# Patient Record
Sex: Female | Born: 1990
Health system: Southern US, Community
[De-identification: ages and names within clinical notes are randomized; demographics above are authoritative.]

## PROBLEM LIST (undated history)

## (undated) DIAGNOSIS — F32A Depression, unspecified: Secondary | ICD-10-CM

## (undated) DIAGNOSIS — A599 Trichomoniasis, unspecified: Secondary | ICD-10-CM

## (undated) DIAGNOSIS — N939 Abnormal uterine and vaginal bleeding, unspecified: Secondary | ICD-10-CM

## (undated) DIAGNOSIS — N898 Other specified noninflammatory disorders of vagina: Secondary | ICD-10-CM

## (undated) DIAGNOSIS — N920 Excessive and frequent menstruation with regular cycle: Secondary | ICD-10-CM

## (undated) DIAGNOSIS — E119 Type 2 diabetes mellitus without complications: Secondary | ICD-10-CM

## (undated) DIAGNOSIS — Z309 Encounter for contraceptive management, unspecified: Secondary | ICD-10-CM

## (undated) DIAGNOSIS — E669 Obesity, unspecified: Secondary | ICD-10-CM

## (undated) DIAGNOSIS — Z8619 Personal history of other infectious and parasitic diseases: Secondary | ICD-10-CM

## (undated) HISTORY — DX: Excessive and frequent menstruation with regular cycle: N92.0

## (undated) HISTORY — DX: Other specified noninflammatory disorders of vagina: N89.8

## (undated) HISTORY — DX: Personal history of other infectious and parasitic diseases: Z86.19

## (undated) HISTORY — DX: Encounter for contraceptive management, unspecified: Z30.9

## (undated) HISTORY — DX: Abnormal uterine and vaginal bleeding, unspecified: N93.9

## (undated) HISTORY — DX: Obesity, unspecified: E66.9

## (undated) HISTORY — DX: Trichomoniasis, unspecified: A59.9

---
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
## 2011-02-27 ENCOUNTER — Emergency Department (HOSPITAL_COMMUNITY)
Admission: EM | Admit: 2011-02-27 | Discharge: 2011-02-27 | Disposition: A | Discharge status: Home / Self Care | Payer: Medicaid Other | Attending: Emergency Medicine | Admitting: Emergency Medicine

## 2011-02-27 DIAGNOSIS — R21 Rash and other nonspecific skin eruption: Secondary | ICD-10-CM | POA: Insufficient documentation

## 2011-12-29 ENCOUNTER — Encounter (HOSPITAL_COMMUNITY): Payer: Self-pay | Admitting: *Deleted

## 2011-12-29 ENCOUNTER — Emergency Department (HOSPITAL_COMMUNITY)
Admission: EM | Admit: 2011-12-29 | Discharge: 2011-12-29 | Disposition: A | Discharge status: Home / Self Care | Payer: Medicaid Other | Attending: Emergency Medicine | Admitting: Emergency Medicine

## 2011-12-29 DIAGNOSIS — J069 Acute upper respiratory infection, unspecified: Secondary | ICD-10-CM

## 2011-12-29 MED ORDER — PREDNISONE 10 MG PO TABS: ORAL_TABLET | ORAL | Status: DC

## 2011-12-29 MED ORDER — GUAIFENESIN-CODEINE 100-10 MG/5ML PO SYRP: 10.0000 mL | ORAL_SOLUTION | Freq: Three times a day (TID) | ORAL | Status: AC | PRN

## 2011-12-29 NOTE — Discharge Instructions (Signed)
Cool Mist Vaporizers Vaporizers may help relieve the symptoms of a cough and cold. By adding water to the air, mucus may become thinner and less sticky. This makes it easier to breathe and cough up secretions. Vaporizers have not been proven to show they help with colds. You should not use a vaporizer if you are allergic to mold. Cool mist vaporizers do not cause serious burns like hot mist vaporizers ("steamers"). HOME CARE INSTRUCTIONS  Follow the package instructions for your vaporizer.   Use a vaporizer that holds a large volume of water (1 to 2 gallons [5.7 to 7.5 liters]).   Do not use anything other than distilled water in the vaporizer.   Do not run the vaporizer all of the time. This can cause mold or bacteria to grow in the vaporizer.   Clean the vaporizer after each time you use it.   Clean and dry the vaporizer well before you store it.   Stop using a vaporizer if you develop worsening respiratory symptoms.  Document Released: 05/13/2004 Document Revised: 08/05/2011 Document Reviewed: 04/10/2009 [facility] Patient Information 2012 [place], [place].Upper Respiratory Infection, Adult An upper respiratory infection (URI) is also known as the common cold. It is often caused by a type of germ (virus). Colds are easily spread (contagious). You can pass it to others by kissing, coughing, sneezing, or drinking out of the same glass. Usually, you get better in 1 or 2 weeks.  HOME CARE   Only take medicine as told by your doctor.   Use a warm mist humidifier or breathe in steam from a hot shower.   Drink enough water and fluids to keep your pee (urine) clear or pale yellow.   Get plenty of rest.   Return to work when your temperature is back to normal or as told by your doctor. You may use a face mask and wash your hands to stop your cold from spreading.  GET HELP RIGHT AWAY IF:   After the first few days, you feel you are getting worse.   You have questions about your medicine.     You have chills, shortness of breath, or brown or red spit (mucus).   You have yellow or brown snot (nasal discharge) or pain in the face, especially when you bend forward.   You have a fever, puffy (swollen) neck, pain when you swallow, or white spots in the back of your throat.   You have a bad headache, ear pain, sinus pain, or chest pain.   You have a high-pitched whistling sound when you breathe in and out (wheezing).   You have a lasting cough or cough up blood.   You have sore muscles or a stiff neck.  MAKE SURE YOU:   Understand these instructions.   Will watch your condition.   Will get help right away if you are not doing well or get worse.  Document Released: 02/02/2008 Document Revised: 08/05/2011 Document Reviewed: 12/21/2010 [facility] Patient Information 2012 [place], [place].

## 2011-12-29 NOTE — ED Notes (Signed)
Sore throat, ear pain, sinus congestion.headache.

## 2011-12-29 NOTE — ED Notes (Signed)
Sneezing, nasal congestion, scratchy throat and HA since yesterday, denies fever or N/V

## 2012-01-02 NOTE — ED Provider Notes (Signed)
History     CSN: [id number]  Arrival date & time 12/29/11  2032   First MD Initiated Contact with Patient 12/29/11 2138      Chief Complaint  Patient presents with  . Sinusitis    (Consider location/radiation/quality/duration/timing/severity/associated sxs/prior treatment) Patient is a 21 y.o. female presenting with sinusitis. The history is provided by the patient.  Sinusitis  This is a new problem. The current episode started more than 2 days ago. The problem has not changed since onset.There has been no fever. The pain is mild. Associated symptoms include congestion, sinus pressure, sore throat and cough. Pertinent negatives include no chills, no ear pain, no hoarse voice, no swollen glands and no shortness of breath. She has tried nothing for the symptoms. The treatment provided no relief.    History reviewed. No pertinent past medical history.  History reviewed. No pertinent past surgical history.  History reviewed. No pertinent family history.  History  Substance Use Topics  . Smoking status: Never Smoker   . Smokeless tobacco: Not on file  . Alcohol Use: No    OB History    Grav Para Term Preterm Abortions TAB SAB Ect Mult Living                  Review of Systems  Constitutional: Negative for chills.  HENT: Positive for congestion, sore throat, rhinorrhea and sinus pressure. Negative for ear pain, hoarse voice and trouble swallowing.   Respiratory: Positive for cough. Negative for shortness of breath, wheezing and stridor.   Gastrointestinal: Negative for nausea and vomiting.  Musculoskeletal: Negative for arthralgias.  Skin: Negative for rash.  Neurological: Positive for headaches. Negative for dizziness, facial asymmetry, weakness and numbness.  All other systems reviewed and are negative.    Allergies  Review of patient's allergies indicates no known allergies.  Home Medications   Current Outpatient Rx  Name Route Sig Dispense Refill  .  GUAIFENESIN-CODEINE 100-10 MG/5ML PO SYRP Oral Take 10 mLs by mouth 3 (three) times daily as needed for cough. 100 mL 0  . PREDNISONE 10 MG PO TABS  Take 6 tablets day one, 5 tablets day two, 4 tablets day three, 3 tablets day four, 2 tablets day five, then 1 tablet day six 21 tablet 0    BP 136/72  Pulse 93  Temp(Src) 97.9 F (36.6 C) (Oral)  Resp 20  Ht 5\' 4"  (1.626 m)  Wt 260 lb (117.935 kg)  BMI 44.63 kg/m2  SpO2 100%  LMP 11/30/2011  Physical Exam  Nursing note and vitals reviewed. Constitutional: She is oriented to person, place, and time. She appears well-developed and well-nourished. No distress.  HENT:  Head: Normocephalic and atraumatic.  Right Ear: Tympanic membrane and ear canal normal.  Left Ear: Tympanic membrane and ear canal normal.  Nose: Mucosal edema and rhinorrhea present. Right sinus exhibits maxillary sinus tenderness and frontal sinus tenderness. Left sinus exhibits maxillary sinus tenderness and frontal sinus tenderness.  Mouth/Throat: Uvula is midline, oropharynx is clear and moist and mucous membranes are normal.  Neck: Normal range of motion. Neck supple.  Cardiovascular: Normal rate, regular rhythm, normal heart sounds and intact distal pulses.   No murmur heard. Pulmonary/Chest: Effort normal and breath sounds normal. No respiratory distress.  Musculoskeletal: Normal range of motion.  Lymphadenopathy:    She has no cervical adenopathy.  Neurological: She is alert and oriented to person, place, and time. She exhibits normal muscle tone. Coordination normal.  Skin: Skin is warm and dry.  ED Course  Procedures (including critical care time)    1. Acute URI       MDM     Patient is alert, NOn-toxic appearing.  Vitals stable.  Sx's likely viral.    Patient / Family / Caregiver understand and agree with initial ED impression and plan with expectations set for ED visit. Pt stable in ED with no significant deterioration in condition. Pt feels  improved after observation and/or treatment in ED.          Glendora Clouatre L. Brittlyn Cloe, [place] 01/02/12 2317

## 2012-01-03 NOTE — ED Provider Notes (Signed)
Medical screening examination/treatment/procedure(s) were performed by non-physician practitioner and as supervising physician I was immediately available for consultation/collaboration.   Benny Lennert, MD 01/03/12 [phone]

## 2012-04-11 ENCOUNTER — Emergency Department (HOSPITAL_COMMUNITY)
Admission: EM | Admit: 2012-04-11 | Discharge: 2012-04-11 | Discharge status: Absent without leave | Payer: Medicare Other | Source: Home / Self Care

## 2012-06-23 DIAGNOSIS — Z3202 Encounter for pregnancy test, result negative: Secondary | ICD-10-CM | POA: Diagnosis not present

## 2012-06-23 DIAGNOSIS — Z1389 Encounter for screening for other disorder: Secondary | ICD-10-CM | POA: Diagnosis not present

## 2012-06-23 DIAGNOSIS — Z113 Encounter for screening for infections with a predominantly sexual mode of transmission: Secondary | ICD-10-CM | POA: Diagnosis not present

## 2012-06-23 DIAGNOSIS — N760 Acute vaginitis: Secondary | ICD-10-CM | POA: Diagnosis not present

## 2012-09-15 DIAGNOSIS — Z1389 Encounter for screening for other disorder: Secondary | ICD-10-CM | POA: Diagnosis not present

## 2012-09-15 DIAGNOSIS — N760 Acute vaginitis: Secondary | ICD-10-CM | POA: Diagnosis not present

## 2012-09-15 DIAGNOSIS — N925 Other specified irregular menstruation: Secondary | ICD-10-CM | POA: Diagnosis not present

## 2012-09-15 DIAGNOSIS — N938 Other specified abnormal uterine and vaginal bleeding: Secondary | ICD-10-CM | POA: Diagnosis not present

## 2012-09-15 DIAGNOSIS — Z3202 Encounter for pregnancy test, result negative: Secondary | ICD-10-CM | POA: Diagnosis not present

## 2012-09-15 DIAGNOSIS — N949 Unspecified condition associated with female genital organs and menstrual cycle: Secondary | ICD-10-CM | POA: Diagnosis not present

## 2012-12-28 ENCOUNTER — Encounter: Payer: Self-pay | Admitting: *Deleted

## 2012-12-28 DIAGNOSIS — Z8619 Personal history of other infectious and parasitic diseases: Secondary | ICD-10-CM | POA: Insufficient documentation

## 2012-12-29 ENCOUNTER — Encounter: Payer: Self-pay | Admitting: *Deleted

## 2013-04-09 ENCOUNTER — Other Ambulatory Visit: Payer: Self-pay | Admitting: Adult Health

## 2013-10-11 DIAGNOSIS — Z833 Family history of diabetes mellitus: Secondary | ICD-10-CM | POA: Diagnosis not present

## 2013-10-11 DIAGNOSIS — J309 Allergic rhinitis, unspecified: Secondary | ICD-10-CM | POA: Diagnosis not present

## 2014-01-07 DIAGNOSIS — J210 Acute bronchiolitis due to respiratory syncytial virus: Secondary | ICD-10-CM | POA: Diagnosis not present

## 2014-02-08 ENCOUNTER — Telehealth: Payer: Self-pay | Admitting: *Deleted

## 2014-02-08 NOTE — Telephone Encounter (Signed)
Pt c/o abnormal vaginal bleeding. Pt has not been seen in our office for over a year. Call transferred to front staff for an appt to be made first next week.

## 2014-02-11 ENCOUNTER — Ambulatory Visit (INDEPENDENT_AMBULATORY_CARE_PROVIDER_SITE_OTHER): Payer: Medicare Other | Admitting: Adult Health

## 2014-02-11 ENCOUNTER — Encounter: Payer: Self-pay | Admitting: Adult Health

## 2014-02-11 VITALS — BP 122/68 | Ht 64.0 in | Wt 349.0 lb

## 2014-02-11 DIAGNOSIS — Z3202 Encounter for pregnancy test, result negative: Secondary | ICD-10-CM

## 2014-02-11 DIAGNOSIS — N920 Excessive and frequent menstruation with regular cycle: Secondary | ICD-10-CM | POA: Diagnosis not present

## 2014-02-11 HISTORY — DX: Excessive and frequent menstruation with regular cycle: N92.0

## 2014-02-11 LAB — POCT URINE PREGNANCY: Preg Test, Ur: NEGATIVE

## 2014-02-11 NOTE — Progress Notes (Signed)
Subjective:     Patient ID: Caroline Yoder, female   DOB: 1991-06-28, 23 y.o.   MRN: [id number]  HPI Caroline Yoder is a 23 year old black female in complaining of 7 day period with some clots, she is using condoms, and has had this happen before and used megace.  Review of Systems See HPI Reviewed past medical,surgical, social and family history. Reviewed medications and allergies.     Objective:   Physical Exam BP 122/68  Ht 5\' 4"  (1.626 m)  Wt 349 lb (158.305 kg)  BMI 59.88 kg/m2  LMP 06/10/2015UPT negative, Skin warm and dry.Pelvic: external genitalia is normal in appearance, vagina:no bleeding, scant discharge without odor, cervix:smooth, uterus: normal size, shape and contour, non tender, no masses felt, adnexa: no masses or tenderness noted.    Discussed will get US to assess uterus and ovaries.  Assessment:     Menorrhagia    Plan:     Return in 1 week for US and see me   review handout on menorrhagia

## 2014-02-11 NOTE — Patient Instructions (Signed)
Menorrhagia Menorrhagia is the medical term for when your menstrual periods are heavy or last longer than usual. With menorrhagia, every period you have may cause enough blood loss and cramping that you are unable to maintain your usual activities. CAUSES  In some cases, the cause of heavy periods is unknown, but a number of conditions may cause menorrhagia. Common causes include:  A problem with the hormone-producing thyroid gland (hypothyroid).  Noncancerous growths in the uterus (polyps or fibroids).  An imbalance of the estrogen and progesterone hormones.  One of your ovaries not releasing an egg during one or more months.  Side effects of having an intrauterine device (IUD).  Side effects of some medicines, such as anti-inflammatory medicines or blood thinners.  A bleeding disorder that stops your blood from clotting normally. SIGNS AND SYMPTOMS  During a normal period, bleeding lasts between 4 and 8 days. Signs that your periods are too heavy include:  You routinely have to change your pad or tampon every 1 or 2 hours because it is completely soaked.  You pass blood clots larger than 1 inch (2.5 cm) in size.  You have bleeding for more than 7 days.  You need to use pads and tampons at the same time because of heavy bleeding.  You need to wake up to change your pads or tampons during the night.  You have symptoms of anemia, such as tiredness, fatigue, or shortness of breath. DIAGNOSIS  Your health care provider will perform a physical exam and ask you questions about your symptoms and menstrual history. Other tests may be ordered based on what the health care provider finds during the exam. These tests can include:  Blood tests To check if you are pregnant or have hormonal changes, a bleeding or thyroid disorder, low iron levels (anemia), or other problems.  Endometrial biopsy Your health care provider takes a sample of tissue from the inside of your uterus to be examined  under a microscope.  Pelvic ultrasound This test uses sound waves to make a picture of your uterus, ovaries, and vagina. The pictures can show if you have fibroids or other growths.  Hysteroscopy For this test, your health care provider will use a small telescope to look inside your uterus. Based on the results of your initial tests, your health care provider may recommend further testing. TREATMENT  Treatment may not be needed. If it is needed, your health care provider may recommend treatment with one or more medicines first. If these do not reduce bleeding enough, a surgical treatment might be an option. The best treatment for you will depend on:   Whether you need to prevent pregnancy.  Your desire to have children in the future.  The cause and severity of your bleeding.  Your opinion and personal preference.  Medicines for menorrhagia may include:  Birth control methods that use hormones These include the pill, skin patch, vaginal ring, shots that you get every 3 months, hormonal IUD, and implant. These treatments reduce bleeding during your menstrual period.  Medicines that thicken blood and slow bleeding.  Medicines that reduce swelling, such as ibuprofen.  Medicines that contain a synthetic hormone called progestin.   Medicines that make the ovaries stop working for a short time.  You may need surgical treatment for menorrhagia if the medicines are unsuccessful. Treatment options include:  Dilation and curettage (D&C) In this procedure, your health care provider opens (dilates) your cervix and then scrapes or suctions tissue from the lining of your  uterus to reduce menstrual bleeding.  Operative hysteroscopy This procedure uses a tiny tube with a light (hysteroscope) to view your uterine cavity and can help in the surgical removal of a polyp that may be causing heavy periods.  Endometrial ablation Through various techniques, your health care provider permanently  destroys the entire lining of your uterus (endometrium). After endometrial ablation, most women have little or no menstrual flow. Endometrial ablation reduces your ability to become pregnant.  Endometrial resection This surgical procedure uses an electrosurgical wire loop to remove the lining of the uterus. This procedure also reduces your ability to become pregnant.  Hysterectomy Surgical removal of the uterus and cervix is a permanent procedure that stops menstrual periods. Pregnancy is not possible after a hysterectomy. This procedure requires anesthesia and hospitalization. HOME CARE INSTRUCTIONS   Only take over-the-counter or prescription medicines as directed by your health care provider. Take prescribed medicines exactly as directed. Do not change or switch medicines without consulting your health care provider.  Take any prescribed iron pills exactly as directed by your health care provider. Long-term heavy bleeding may result in low iron levels. Iron pills help replace the iron your body lost from heavy bleeding. Iron may cause constipation. If this becomes a problem, increase the bran, fruits, and roughage in your diet.  Do not take aspirin or medicines that contain aspirin 1 week before or during your menstrual period. Aspirin may make the bleeding worse.  If you need to change your sanitary pad or tampon more than once every 2 hours, stay in bed and rest as much as possible until the bleeding stops.  Eat well-balanced meals. Eat foods high in iron. Examples are leafy green vegetables, meat, liver, eggs, and whole grain breads and cereals. Do not try to lose weight until the abnormal bleeding has stopped and your blood iron level is back to normal. SEEK MEDICAL CARE IF:   You soak through a pad or tampon every 1 or 2 hours, and this happens every time you have a period.  You need to use pads and tampons at the same time because you are bleeding so much.  You need to change your pad  or tampon during the night.  You have a period that lasts for more than 8 days.  You pass clots bigger than 1 inch wide.  You have irregular periods that happen more or less often than once a month.  You feel dizzy or faint.  You feel very weak or tired.  You feel short of breath or feel your heart is beating too fast when you exercise.  You have nausea and vomiting or diarrhea while you are taking your medicine.  You have any problems that may be related to the medicine you are taking. SEEK IMMEDIATE MEDICAL CARE IF:   You soak through 4 or more pads or tampons in 2 hours.  You have any bleeding while you are pregnant. MAKE SURE YOU:   Understand these instructions.  Will watch your condition.  Will get help right away if you are not doing well or get worse. Document Released: 08/16/2005 Document Revised: 06/06/2013 Document Reviewed: 02/04/2013 [facility] Patient Information 2014 [place]. Return in 1 week for [place]

## 2014-02-19 ENCOUNTER — Other Ambulatory Visit: Payer: Self-pay | Admitting: Adult Health

## 2014-02-19 DIAGNOSIS — N920 Excessive and frequent menstruation with regular cycle: Secondary | ICD-10-CM

## 2014-02-20 ENCOUNTER — Ambulatory Visit (INDEPENDENT_AMBULATORY_CARE_PROVIDER_SITE_OTHER): Payer: Medicare Other

## 2014-02-20 ENCOUNTER — Ambulatory Visit (INDEPENDENT_AMBULATORY_CARE_PROVIDER_SITE_OTHER): Payer: Medicare Other | Admitting: Adult Health

## 2014-02-20 ENCOUNTER — Other Ambulatory Visit: Payer: Medicare Other

## 2014-02-20 ENCOUNTER — Encounter: Payer: Self-pay | Admitting: Adult Health

## 2014-02-20 VITALS — BP 112/70 | Ht 64.0 in | Wt 345.6 lb

## 2014-02-20 DIAGNOSIS — N920 Excessive and frequent menstruation with regular cycle: Secondary | ICD-10-CM

## 2014-02-20 MED ORDER — MEGESTROL ACETATE 40 MG PO TABS: 40.0000 mg | ORAL_TABLET | Freq: Every day | ORAL | Status: DC

## 2014-02-20 NOTE — Progress Notes (Signed)
Subjective:     Patient ID: Caroline Yoder Age, female   DOB: 10-06-1990, 23 y.o.   MRN: [id number]  HPI Caroline Yoder is a 23 year old black female in for a US to assess uterus having heavy periods.  Review of Systems See HPI Reviewed past medical,surgical, social and family history. Reviewed medications and allergies.     Objective:   Physical Exam BP 112/70  Ht 5\' 4"  (1.626 m)  Wt 345 lb 9.6 oz (156.763 kg)  BMI 59.29 kg/m2  LMP 02/11/2014   Reviewed US with pt. Uterus 7.1 x 5.4 x 3.8 cm, retroverted no myometrial masses noted  Endometrium 13.2 mm, symmetrical, no obvious mass noted within the cavity  Right ovary 3.2 x 1.9 x 1.8 cm,  Left ovary 2.4 x 1.7 x 1.6 cm,  No free fluid or adnexal masses noted within the pelvis  *Pt noted tenderness during ultrasound  Technician Comments:  Retroverted uterus, Endom-13+mm, bilateral ovaries appear WNL,no free fluid or adnexal masses noted within the pelvis  Pt had labs with Dr Sudie BaileyKnowlton recently and they were OK per pt. And he did her pap. Pt wants to try megace again, it worked before.  Assessment:     Menorrhagia with regular cycle    Plan:     Rx megace 40 mg #30 1 daily with 3 refills Follow up in 3 months Review handout on menorrhagia Use condoms if has sex Try to work on losing weight

## 2014-02-20 NOTE — Patient Instructions (Signed)

## 2014-05-23 ENCOUNTER — Ambulatory Visit: Payer: Medicare Other | Admitting: Adult Health

## 2014-05-24 ENCOUNTER — Ambulatory Visit: Payer: Medicare Other | Admitting: Adult Health

## 2015-03-27 ENCOUNTER — Encounter: Payer: Self-pay | Admitting: Adult Health

## 2015-03-27 ENCOUNTER — Ambulatory Visit: Payer: Medicare Other | Admitting: Adult Health

## 2015-04-09 ENCOUNTER — Ambulatory Visit: Payer: Medicare Other | Admitting: Adult Health

## 2015-05-22 ENCOUNTER — Telehealth: Payer: Self-pay | Admitting: Adult Health

## 2015-05-22 MED ORDER — MEGESTROL ACETATE 40 MG PO TABS: 40.0000 mg | ORAL_TABLET | Freq: Every day | ORAL | Status: DC

## 2015-05-22 NOTE — Addendum Note (Signed)
Addended by: Cyril Mourning A on: 05/22/2015 05:31 PM   Modules accepted: Orders

## 2015-05-22 NOTE — Telephone Encounter (Signed)
Pt requesting refill on Megace for BTB, appt made for 05/23/2015.

## 2015-05-22 NOTE — Telephone Encounter (Signed)
Will rx megace keep appt

## 2015-05-23 ENCOUNTER — Ambulatory Visit (INDEPENDENT_AMBULATORY_CARE_PROVIDER_SITE_OTHER): Payer: Medicare Other | Admitting: Adult Health

## 2015-05-23 ENCOUNTER — Ambulatory Visit: Payer: Medicare Other | Admitting: Adult Health

## 2015-05-23 ENCOUNTER — Encounter: Payer: Self-pay | Admitting: Adult Health

## 2015-05-23 VITALS — BP 130/82 | HR 74 | Ht 64.0 in | Wt 372.0 lb

## 2015-05-23 DIAGNOSIS — Z3202 Encounter for pregnancy test, result negative: Secondary | ICD-10-CM | POA: Diagnosis not present

## 2015-05-23 DIAGNOSIS — Z309 Encounter for contraceptive management, unspecified: Secondary | ICD-10-CM

## 2015-05-23 DIAGNOSIS — A599 Trichomoniasis, unspecified: Secondary | ICD-10-CM | POA: Insufficient documentation

## 2015-05-23 DIAGNOSIS — Z30011 Encounter for initial prescription of contraceptive pills: Secondary | ICD-10-CM

## 2015-05-23 DIAGNOSIS — N939 Abnormal uterine and vaginal bleeding, unspecified: Secondary | ICD-10-CM

## 2015-05-23 DIAGNOSIS — N898 Other specified noninflammatory disorders of vagina: Secondary | ICD-10-CM

## 2015-05-23 HISTORY — DX: Abnormal uterine and vaginal bleeding, unspecified: N93.9

## 2015-05-23 HISTORY — DX: Other specified noninflammatory disorders of vagina: N89.8

## 2015-05-23 HISTORY — DX: Encounter for contraceptive management, unspecified: Z30.9

## 2015-05-23 HISTORY — DX: Trichomoniasis, unspecified: A59.9

## 2015-05-23 LAB — POCT WET PREP (WET MOUNT)
Trichomonas Wet Prep HPF POC: POSITIVE
WBC, Wet Prep HPF POC: POSITIVE

## 2015-05-23 LAB — POCT URINE PREGNANCY: Preg Test, Ur: NEGATIVE

## 2015-05-23 MED ORDER — METRONIDAZOLE 500 MG PO TABS: ORAL_TABLET | ORAL | Status: DC

## 2015-05-23 MED ORDER — NORETHIN ACE-ETH ESTRAD-FE 1-20 MG-MCG PO TABS: 1.0000 | ORAL_TABLET | Freq: Every day | ORAL | Status: DC

## 2015-05-23 NOTE — Patient Instructions (Addendum)
Start pills today  No sex til after next appt 10/10 for pap and physical No alcohol with flagyl Trichomoniasis Trichomoniasis is an infection caused by an organism called Trichomonas. The infection can affect both women and men. In women, the outer female genitalia and the vagina are affected. In men, the penis is mainly affected, but the prostate and other reproductive organs can also be involved. Trichomoniasis is a sexually transmitted infection (STI) and is most often passed to another person through sexual contact.  RISK FACTORS  Having unprotected sexual intercourse.  Having sexual intercourse with an infected partner. SIGNS AND SYMPTOMS  Symptoms of trichomoniasis in women include:  Abnormal gray-green frothy vaginal discharge.  Itching and irritation of the vagina.  Itching and irritation of the area outside the vagina. Symptoms of trichomoniasis in men include:   Penile discharge with or without pain.  Pain during urination. This results from inflammation of the urethra. DIAGNOSIS  Trichomoniasis may be found during a Pap test or physical exam. Your health care provider may use one of the following methods to help diagnose this infection:  Examining vaginal discharge under a microscope. For men, urethral discharge would be examined.  Testing the pH of the vagina with a test tape.  Using a vaginal swab test that checks for the Trichomonas organism. A test is available that provides results within a few minutes.  Doing a culture test for the organism. This is not usually needed. TREATMENT   You may be given medicine to fight the infection. Women should inform their health care provider if they could be or are pregnant. Some medicines used to treat the infection should not be taken during pregnancy.  Your health care provider may recommend over-the-counter medicines or creams to decrease itching or irritation.  Your sexual partner will need to be treated if  infected. HOME CARE INSTRUCTIONS   Take medicines only as directed by your health care provider.  Take over-the-counter medicine for itching or irritation as directed by your health care provider.  Do not have sexual intercourse while you have the infection.  Women should not douche or wear tampons while they have the infection.  Discuss your infection with your partner. Your partner may have gotten the infection from you, or you may have gotten it from your partner.  Have your sex partner get examined and treated if necessary.  Practice safe, informed, and protected sex.  See your health care provider for other STI testing. SEEK MEDICAL CARE IF:   You still have symptoms after you finish your medicine.  You develop abdominal pain.  You have pain when you urinate.  You have bleeding after sexual intercourse.  You develop a rash.  Your medicine makes you sick or makes you throw up (vomit). MAKE SURE YOU:  Understand these instructions.  Will watch your condition.  Will get help right away if you are not doing well or get worse. Document Released: 02/09/2001 Document Revised: 12/31/2013 Document Reviewed: 05/28/2013 [facility] Patient Information 2015 [place], [place]. This information is not intended to replace advice given to you by your health care provider. Make sure you discuss any questions you have with your health care provider.

## 2015-05-23 NOTE — Progress Notes (Signed)
Subjective:     Patient ID: Caroline Yoder, female   DOB: 1990-10-22, 24 y.o.   MRN: [id number]  HPI Caroline Yoder is a 24 year old black female in complaining of bleeding since period started in August, took megace last night and bleeding stopped,has not had sex since mid August. Uses condoms.  Review of Systems Patient denies any headaches, hearing loss, fatigue, blurred vision, shortness of breath, chest pain, abdominal pain, problems with bowel movements, urination, or intercourse. No joint pain or mood swings.See HPI for positives. Reviewed past medical,surgical, social and family history. Reviewed medications and allergies.     Objective:   Physical Exam BP 130/82 mmHg  Pulse 74  Ht  (1.626 m)  Wt 372 lb (168.738 kg)  BMI 63.82 kg/m2  LMP 04/26/2015 UPT negative, Skin warm and dry.Pelvic: external genitalia is normal in appearance no lesions, vagina: white discharge with odor,urethra has no lesions or masses noted, cervix:smooth, uterus: normal size, shape and contour, non tender, no masses felt, adnexa: no masses or tenderness noted. Bladder is non tender and no masses felt. Wet prep: + for trich and +WBCs. GC/CHL obtained. No bleeding noted. DO not take megace any more will rx OCs.    Assessment:     AUB Vaginal discharge Trichomoniasis Contraceptive management    Plan:    GC/CHL sent Rx flagyl 500 mg #4 4 po now, partner to clinic No sex or alcohol Rx junel 1-20 fe take 1 daily start today, dips 1 pack with 1 refill Return 10/10 for pap and physical and POT Review handout on trich   Always use condoms

## 2015-05-26 LAB — GC/CHLAMYDIA PROBE AMP
Chlamydia trachomatis, NAA: NEGATIVE
Neisseria gonorrhoeae by PCR: NEGATIVE

## 2015-06-09 ENCOUNTER — Other Ambulatory Visit: Payer: Medicare Other | Admitting: Adult Health

## 2015-07-21 ENCOUNTER — Telehealth: Payer: Self-pay | Admitting: Adult Health

## 2015-07-21 NOTE — Telephone Encounter (Signed)
Pt has bleeding and needs pap and physical and POT of trich to come in am at 8:30

## 2015-07-21 NOTE — Telephone Encounter (Signed)
Left message I called 

## 2015-07-22 ENCOUNTER — Other Ambulatory Visit: Payer: Medicare Other | Admitting: Adult Health

## 2015-08-12 ENCOUNTER — Other Ambulatory Visit: Payer: Self-pay | Admitting: Adult Health

## 2015-08-19 ENCOUNTER — Telehealth: Payer: Self-pay | Admitting: *Deleted

## 2015-08-19 ENCOUNTER — Ambulatory Visit (INDEPENDENT_AMBULATORY_CARE_PROVIDER_SITE_OTHER): Payer: Medicare Other | Admitting: Women's Health

## 2015-08-19 ENCOUNTER — Telehealth: Payer: Self-pay | Admitting: Women's Health

## 2015-08-19 ENCOUNTER — Encounter: Payer: Self-pay | Admitting: Women's Health

## 2015-08-19 VITALS — BP 130/82 | Ht 65.0 in | Wt 368.0 lb

## 2015-08-19 DIAGNOSIS — A499 Bacterial infection, unspecified: Secondary | ICD-10-CM | POA: Diagnosis not present

## 2015-08-19 DIAGNOSIS — N760 Acute vaginitis: Secondary | ICD-10-CM | POA: Insufficient documentation

## 2015-08-19 DIAGNOSIS — N921 Excessive and frequent menstruation with irregular cycle: Secondary | ICD-10-CM | POA: Diagnosis not present

## 2015-08-19 DIAGNOSIS — A599 Trichomoniasis, unspecified: Secondary | ICD-10-CM | POA: Diagnosis not present

## 2015-08-19 DIAGNOSIS — B9689 Other specified bacterial agents as the cause of diseases classified elsewhere: Secondary | ICD-10-CM

## 2015-08-19 LAB — POCT WET PREP (WET MOUNT): Clue Cells Wet Prep Whiff POC: POSITIVE

## 2015-08-19 MED ORDER — METRONIDAZOLE 500 MG PO TABS: 500.0000 mg | ORAL_TABLET | Freq: Two times a day (BID) | ORAL | Status: DC

## 2015-08-19 MED ORDER — NORGESTIMATE-ETH ESTRADIOL 0.25-35 MG-MCG PO TABS: 1.0000 | ORAL_TABLET | Freq: Every day | ORAL | Status: DC

## 2015-08-19 MED ORDER — LEVONORGEST-ETH ESTRAD 91-DAY 0.15-0.03 MG PO TABS: 1.0000 | ORAL_TABLET | Freq: Every day | ORAL | Status: DC

## 2015-08-19 NOTE — Progress Notes (Signed)
Patient ID: Caroline CarlShameka Fariss, female   DOB: 11-07-1990, 24 y.o.   MRN: [id number]   [facility]  Patient name: Caroline Yoder MRN [id number]  Date of birth: 11-07-1990  CC & HPI:  Caroline CarlShameka Darin is a 24 y.o. G0P0 African American female presenting today for report of heavy periods x 3 years. Periods are every other month and last for 2 weeks or more, changes saturated tampon and pad q 7030min-1hr at heaviest times, no cramping, ~tennis ball-sized clots. Started junel 1/20 daily in Sept for same, states it hasn't helped at all. Had + trichomonas in Sept, never showed for POC, states instead of taking all 4 pills at once she took 1 pill daily x 4d. Had neg gc/ct 05/23/15. Had normal pelvic u/s for same in 2015. Denies abnormal d/c, itching/odor/irritation. Discussed option of increasing coc's and taking continuously vs. Mirena, pt wants to increase coc's/take continuously at this time. Does not smoke, no h/o HTN, DVT/PE, CVA, MI, or migraines w/ aura.   Patient's last menstrual period was 08/05/2015. and is still on The current method of family planning is OCP (estrogen/progesterone). Last pap 2015 w/ Dr. Sudie BaileyKnowlton, normal  Pertinent History Reviewed:  Medical & Surgical Hx:   Past Medical History  Diagnosis Date  . Hx of chlamydia infection   . Obesity   . Menorrhagia 02/11/2014  . Abnormal uterine bleeding (AUB) 05/23/2015  . Vaginal discharge 05/23/2015  . Trichimoniasis 05/23/2015  . Contraceptive management 05/23/2015   History reviewed. No pertinent past surgical history. Medications: Reviewed & Updated - see associated section Social History: Reviewed -  reports that she has never smoked. She has never used smokeless tobacco.  Objective Findings:  Vitals: BP 130/82 mmHg  Ht 5\' 5"  (1.651 m)  Wt 368 lb (166.924 kg)  BMI 61.24 kg/m2  LMP 08/05/2015 Body mass index is 61.24 kg/(m^2).  Physical Examination: General appearance - alert, well appearing, and in no distress Pelvic - cx  appears normal, mod amt menstrual blood, slightly malodorous  Results for orders placed or performed in visit on 08/19/15 (from the past 24 hour(s))  POCT Wet Prep Mellody Drown(Wet [place])   Collection Time: 08/19/15 12:58 PM  Result Value Ref Range   Source Wet Prep POC vaginal    WBC, Wet Prep HPF POC none    Bacteria Wet Prep HPF POC None None, Few, Too numerous to count   BACTERIA WET PREP MORPHOLOGY POC     Clue Cells Wet Prep HPF POC Moderate (A) None, Too numerous to count   Clue Cells Wet Prep Whiff POC Positive Whiff    Yeast Wet Prep HPF POC None    KOH Wet Prep POC     Trichomonas Wet Prep HPF POC none      Assessment & Plan:  A:   Menorrhagia w/ irregular cycles  BV  Obesity, BMI 61.24  Recent trichomonas infection, poc today neg  P:  Stop junel 1/20 at end of this pack, then begin sprintec daily and skip placebo week to try to stop periods, may need to have period q 3-4 months to decrease intermenstrual spotting that can occur  Rx metronidazole 500mg  BID x 7d for BV, no sex or etoh while taking   Return in about 3 months (around 11/17/2015) for F/U.  Marge DuncansBooker, Shariff Lasky Randall CNM, [facility]NP-BC 08/19/2015 12:58 PM

## 2015-08-19 NOTE — Telephone Encounter (Signed)
Pt states her insurance would not cover Rx for Sprintec. Per [facility] [place], [facility] [place] and it is to soon to refill for BCP. Pt will need to call and cancel the order for Micronor before they can run the Rx for Sprintec. Pt informed and will call pharmacy to get Rx for Micronor canceled.

## 2015-08-19 NOTE — Telephone Encounter (Signed)
Pt informed Seasonale e-scribed.

## 2015-08-19 NOTE — Telephone Encounter (Signed)
Pt insurance will not cover the Sprintec(non preferred med). Please advise?

## 2015-08-19 NOTE — Telephone Encounter (Signed)
Pt called stating that the Birth control Caroline Yoder has sent to her pharmacy is not covered under her insurance. Please contact pt

## 2015-08-19 NOTE — Patient Instructions (Signed)
Skip last week of pills in each pack and go straight to next pack to not have a period  Oral Contraception Use Oral contraceptive pills (OCPs) are medicines taken to prevent pregnancy. OCPs work by preventing the ovaries from releasing eggs. The hormones in OCPs also cause the cervical mucus to thicken, preventing the sperm from entering the uterus. The hormones also cause the uterine lining to become thin, not allowing a fertilized egg to attach to the inside of the uterus. OCPs are highly effective when taken exactly as prescribed. However, OCPs do not prevent sexually transmitted diseases (STDs). Safe sex practices, such as using condoms along with an OCP, can help prevent STDs. Before taking OCPs, you may have a physical exam and Pap test. Your health care provider may also order blood tests if necessary. Your health care provider will make sure you are a good candidate for oral contraception. Discuss with your health care provider the possible side effects of the OCP you may be prescribed. When starting an OCP, it can take 2 to 3 months for the body to adjust to the changes in hormone levels in your body.  HOW TO TAKE ORAL CONTRACEPTIVE PILLS Your health care provider may advise you on how to start taking the first cycle of OCPs. Otherwise, you can:   Start on day 1 of your menstrual period. You will not need any backup contraceptive protection with this start time.   Start on the first Sunday after your menstrual period or the day you get your prescription. In these cases, you will need to use backup contraceptive protection for the first week.   Start the pill at any time of your cycle. If you take the pill within 5 days of the start of your period, you are protected against pregnancy right away. In this case, you will not need a backup form of birth control. If you start at any other time of your menstrual cycle, you will need to use another form of birth control for 7 days. If your OCP is the  type called a minipill, it will protect you from pregnancy after taking it for 2 days (48 hours). After you have started taking OCPs:   If you forget to take 1 pill, take it as soon as you remember. Take the next pill at the regular time.   If you miss 2 or more pills, call your health care provider because different pills have different instructions for missed doses. Use backup birth control until your next menstrual period starts.   If you use a 28-day pack that contains inactive pills and you miss 1 of the last 7 pills (pills with no hormones), it will not matter. Throw away the rest of the non-hormone pills and start a new pill pack.  No matter which day you start the OCP, you will always start a new pack on that same day of the week. Have an extra pack of OCPs and a backup contraceptive method available in case you miss some pills or lose your OCP pack.  HOME CARE INSTRUCTIONS   Do not smoke.   Always use a condom to protect against STDs. OCPs do not protect against STDs.   Use a calendar to mark your menstrual period days.   Read the information and directions that came with your OCP. Talk to your health care provider if you have questions.  SEEK MEDICAL CARE IF:   You develop nausea and vomiting.   You have abnormal vaginal discharge  or bleeding.   You develop a rash.   You miss your menstrual period.   You are losing your hair.   You need treatment for mood swings or depression.   You get dizzy when taking the OCP.   You develop acne from taking the OCP.   You become pregnant.  SEEK IMMEDIATE MEDICAL CARE IF:   You develop chest pain.   You develop shortness of breath.   You have an uncontrolled or severe headache.   You develop numbness or slurred speech.   You develop visual problems.   You develop pain, redness, and swelling in the legs.    This information is not intended to replace advice given to you by your health care provider.  Make sure you discuss any questions you have with your health care provider.   Document Released: 08/05/2011 Document Revised: 09/06/2014 Document Reviewed: 02/04/2013 Elsevier Interactive Patient Education [employer]

## 2015-08-19 NOTE — Telephone Encounter (Signed)
Pt called, states insurance won't cover Sprintec, switched to Seasonale.  Cheral MarkerKimberly R. [name], CNM, [facility]NP[facility]BC 08/19/2015 3:12 PM

## 2015-08-29 ENCOUNTER — Telehealth: Payer: Self-pay | Admitting: Women's Health

## 2015-09-02 MED ORDER — MEGESTROL ACETATE 40 MG PO TABS: ORAL_TABLET | ORAL | Status: DC

## 2015-09-02 NOTE — Telephone Encounter (Signed)
Pt called back stating still bleeding 2wks into starting seasonale. Rx megace algorithm, stop coc's before beginning megace. Once bleeding stops, stop megace and resume coc's. Let us know if bleeding doesn't stop.  Cheral MarkerKimberly R. Dessie Delcarlo, CNM, WHNP-BC 09/02/2015 1:20 PM

## 2015-09-02 NOTE — Telephone Encounter (Signed)
Pt informed Megace e-scribed, start Megace and stop birth control, once bleeding stops, stop Megace and resume birth control. Let [place] know if bleeding doesn't stop. Pt verbalized understanding.

## 2015-11-17 ENCOUNTER — Ambulatory Visit: Payer: Medicare Other | Admitting: Women's Health

## 2015-11-17 ENCOUNTER — Encounter: Payer: Self-pay | Admitting: Women's Health

## 2016-03-25 ENCOUNTER — Ambulatory Visit: Payer: Medicare Other | Admitting: Adult Health

## 2016-04-02 ENCOUNTER — Ambulatory Visit: Payer: Medicare Other | Admitting: Adult Health

## 2016-04-02 ENCOUNTER — Encounter: Payer: Self-pay | Admitting: Adult Health

## 2016-05-05 ENCOUNTER — Encounter (HOSPITAL_COMMUNITY): Payer: Self-pay | Admitting: Emergency Medicine

## 2016-05-05 ENCOUNTER — Emergency Department (HOSPITAL_COMMUNITY)
Admission: EM | Admit: 2016-05-05 | Discharge: 2016-05-05 | Disposition: A | Discharge status: Home / Self Care | Payer: Medicare Other | Attending: Physician Assistant | Admitting: Physician Assistant

## 2016-05-05 DIAGNOSIS — M545 Low back pain, unspecified: Secondary | ICD-10-CM

## 2016-05-05 MED ORDER — NAPROXEN 500 MG PO TABS: 500.0000 mg | ORAL_TABLET | Freq: Two times a day (BID) | ORAL | 0 refills | Status: DC

## 2016-05-05 MED ORDER — KETOROLAC TROMETHAMINE 30 MG/ML IJ SOLN
30.0000 mg | Freq: Once | INTRAMUSCULAR | Status: DC
  Filled 2016-05-05: qty 1

## 2016-05-05 MED ORDER — CYCLOBENZAPRINE HCL 10 MG PO TABS: 10.0000 mg | ORAL_TABLET | Freq: Two times a day (BID) | ORAL | 0 refills | Status: DC | PRN

## 2016-05-05 MED ORDER — NAPROXEN 250 MG PO TABS
500.0000 mg | ORAL_TABLET | Freq: Once | ORAL | Status: AC
  Administered 2016-05-05: 500 mg via ORAL
  Filled 2016-05-05: qty 2

## 2016-05-05 NOTE — ED Triage Notes (Signed)
Pt here for lower back pain into buttocks x 4 days

## 2016-05-05 NOTE — Discharge Instructions (Signed)
Take your medications as prescribed as needed for pain relief. I also recommend resting and applying ice and/or heat to affected area for 15 minutes 3-4 times daily. He may also take Tylenol as prescribed over-the-counter as needed for pain relief. I recommend refraining from sleeping on the ground. Follow-up with your primary care provider in the next week for follow-up if your symptoms continue. Please return to the Emergency Department if symptoms worsen or new onset of fever, numbness, tingling, groin anesthesia, abdominal pain, urinary retention, loss of bowel or bladder, weakness.

## 2016-05-05 NOTE — ED Provider Notes (Signed)
[facility]-EMERGENCY DEPT Provider Note   CSN: [id number] Arrival date & time: 05/05/16  5284     History   Chief Complaint Chief Complaint  Patient presents with  . Back Pain    HPI Caroline Yoder is a 25 y.o. female.  Patient is a 25 year old female with no pertinent past medical history presents the ED with complaint of lower back pain. Patient reports over the past week she has had mildly worsening lower back pain which she states radiates into her buttocks. Patient reports that back pain started after she began sleeping on the ground. Denies any other recent activity, exercises or heavy lifting. Pt denies fever, numbness, tingling, saddle anesthesia, loss of bowel or bladder, abdominal pain, urinary retention, weakness, IVDU, cancer or recent spinal manipulation. Patient states she has been taking ibuprofen intermittently at home without relief. LMP last month.      Past Medical History:  Diagnosis Date  . Abnormal uterine bleeding (AUB) 05/23/2015  . Contraceptive management 05/23/2015  . Hx of chlamydia infection   . Menorrhagia 02/11/2014  . Obesity   . Trichimoniasis 05/23/2015  . Vaginal discharge 05/23/2015    Patient Active Problem List   Diagnosis Date Noted  . BV (bacterial vaginosis) 08/19/2015  . Abnormal uterine bleeding (AUB) 05/23/2015  . Vaginal discharge 05/23/2015  . Trichimoniasis 05/23/2015  . Contraceptive management 05/23/2015  . Menorrhagia 02/11/2014  . Hx of chlamydia infection 12/28/2012    History reviewed. No pertinent surgical history.  OB History    Gravida Para Term Preterm AB Living   0             SAB TAB Ectopic Multiple Live Births                   Home Medications    Prior to Admission medications   Medication Sig Start Date End Date Taking? Authorizing Provider  ibuprofen (ADVIL,MOTRIN) 200 MG tablet Take 400 mg by mouth every 6 (six) hours as needed for mild pain.   Yes Historical Provider, MD  cyclobenzaprine (FLEXERIL) 10  MG tablet Take 1 tablet (10 mg total) by mouth 2 (two) times daily as needed for muscle spasms. 05/05/16   Barrett Henle, PA-C  levonorgestrel-ethinyl estradiol (SEASONALE,INTROVALE,JOLESSA) 0.15-0.03 MG tablet Take 1 tablet by mouth daily. Patient not taking: Reported on 05/05/2016 08/19/15   [name], CNM  megestrol (MEGACE) 40 MG tablet 3x5d, 2x5d, then 1 daily to help control vaginal bleeding. Stop taking when bleeding stops. Do not take with birth control pills Patient not taking: Reported on 05/05/2016 09/02/15   [name], CNM  metroNIDAZOLE (FLAGYL) 500 MG tablet Take 1 tablet (500 mg total) by mouth 2 (two) times daily. X 7 days. No sex or alcohol while taking Patient not taking: Reported on 05/05/2016 08/19/15   [name], CNM  naproxen (NAPROSYN) 500 MG tablet Take 1 tablet (500 mg total) by mouth 2 (two) times daily. 05/05/16   Barrett Henle, PA-C    Family History Family History  Problem Relation Age of Onset  . Diabetes Other   . Diabetes Mother   . Thyroid disease Mother   . Thyroid disease Brother   . Diabetes Brother   . Thyroid disease Brother   . Seizures Brother   . Diabetes Brother   . Hypertension Maternal Grandmother     Social History Social History  Substance Use Topics  . Smoking status: Never Smoker  . Smokeless tobacco: Never Used  .  Alcohol use No     Allergies   Strawberry flavor   Review of Systems Review of Systems  Musculoskeletal: Positive for back pain.  All other systems reviewed and are negative.    Physical Exam Updated Vital Signs BP 125/80 (BP Location: Right Arm)   Pulse 108   Temp 98.6 F (37 C) (Oral)   Resp 19   Ht 5\' 4"  (1.626 m)   Wt (!) 145.2 kg   BMI 54.93 kg/m   Physical Exam  Constitutional: She is oriented to person, place, and time. She appears well-developed and well-nourished.  Morbidly obese female  HENT:  Head: Normocephalic and atraumatic.  Eyes: Conjunctivae and EOM  are normal. Right eye exhibits no discharge. Left eye exhibits no discharge. No scleral icterus.  Neck: Normal range of motion. Neck supple.  Cardiovascular: Normal rate, regular rhythm, normal heart sounds and intact distal pulses.   HR 88  Pulmonary/Chest: Effort normal and breath sounds normal. No respiratory distress. She has no wheezes. She has no rales. She exhibits no tenderness.  Abdominal: Soft. Bowel sounds are normal. She exhibits no distension and no mass. There is no tenderness. There is no rebound and no guarding. No hernia.  No CVA tenderness  Musculoskeletal: She exhibits no edema.  No midline C, T, or L tenderness. Mild TTP over bilatera lumbar paraspinal muscles. Full range of motion of neck and back. Full range of motion of bilateral upper and lower extremities, with 5/5 strength. Sensation intact. 2+ radial and PT pulses. Cap refill <2 seconds. Patient able to stand and ambulate without assistance.    Neurological: She is alert and oriented to person, place, and time. She has normal strength and normal reflexes. No sensory deficit.  Skin: Skin is warm and dry.  Nursing note and vitals reviewed.    ED Treatments / Results  Labs (all labs ordered are listed, but only abnormal results are displayed) Labs Reviewed - No data to display  EKG  EKG Interpretation None       Radiology No results found.  Procedures Procedures (including critical care time)  Medications Ordered in ED Medications  ketorolac (TORADOL) 30 MG/ML injection 30 mg (not administered)     Initial Impression / Assessment and Plan / ED Course  I have reviewed the triage vital signs and the nursing notes.  Pertinent labs & imaging results that were available during my care of the patient were reviewed by me and considered in my medical decision making (see chart for details).  Clinical Course    Patient presents with bilateral lower back pain that started after sleeping on the ground one  week ago. She reports the pain radiates into bilateral buttocks and is worse with movement or bending. No back pain red flags. VSS. Exam revealed mild tenderness over bilateral lumbar paraspinal muscles. No neuro deficits. Patient able to stand and ambulate but endorses pain. I suspect patient's symptoms are likely musculoskeletal in etiology however plan to order urine pregnancy to rule out pregnancy. When discussing plan with patient she reports she urinated in triage prior to coming back to her room and reports that she does not think she can urinate at this time and reports that she typically does not urinate frequently. Pt states she does not feel that she will be able to give [place] a urine sample while in the ED. Plan to d/c pt home with symptomatic tx for suspected muscle strain but advised pt to follow up with PCP within the next  week. Discussed return precautions with pt.   Final Clinical Impressions(s) / ED Diagnoses   Final diagnoses:  Bilateral low back pain without sciatica    New Prescriptions New Prescriptions   CYCLOBENZAPRINE (FLEXERIL) 10 MG TABLET    Take 1 tablet (10 mg total) by mouth 2 (two) times daily as needed for muscle spasms.   NAPROXEN (NAPROSYN) 500 MG TABLET    Take 1 tablet (500 mg total) by mouth 2 (two) times daily.     Satira Sarkicole Elizabeth IrvingtonNadeau, [place] JerseyPA-C 05/05/16 [id number]    Courteney Randall AnLyn Mackuen, MD 05/07/16 1029

## 2016-05-05 NOTE — ED Notes (Signed)
Pt states that she does not want a shot and is refusing the toradol injection.

## 2016-06-10 ENCOUNTER — Ambulatory Visit (INDEPENDENT_AMBULATORY_CARE_PROVIDER_SITE_OTHER): Payer: Medicare Other | Admitting: Women's Health

## 2016-06-10 ENCOUNTER — Encounter: Payer: Self-pay | Admitting: Women's Health

## 2016-06-10 VITALS — BP 168/98 | HR 80 | Wt 371.2 lb

## 2016-06-10 DIAGNOSIS — R030 Elevated blood-pressure reading, without diagnosis of hypertension: Secondary | ICD-10-CM | POA: Diagnosis not present

## 2016-06-10 DIAGNOSIS — N921 Excessive and frequent menstruation with irregular cycle: Secondary | ICD-10-CM

## 2016-06-10 DIAGNOSIS — Z6841 Body Mass Index (BMI) 40.0 and over, adult: Secondary | ICD-10-CM | POA: Diagnosis not present

## 2016-06-10 DIAGNOSIS — E668 Other obesity: Secondary | ICD-10-CM | POA: Diagnosis not present

## 2016-06-10 MED ORDER — MEGESTROL ACETATE 40 MG PO TABS
ORAL_TABLET | ORAL | 0 refills | Status: DC
Start: 2016-06-10 — End: 2016-09-17

## 2016-06-10 NOTE — Patient Instructions (Signed)
Make appointment with Dr. Sudie BaileyKnowlton to check on your blood pressure  Whole 30   Decrease carbohydrates (bread, rice, pasta, potatoes, sweets, candy, sodas)  Drink lots of water  Call after you see Dr. Sudie BaileyKnowlton and have blood pressure under control, we will discuss getting back on birth control for 6 months to regulate your periods, then try to get pregnant
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
## 2016-08-04 ENCOUNTER — Emergency Department (HOSPITAL_COMMUNITY)
Admission: EM | Admit: 2016-08-04 | Discharge: 2016-08-04 | Disposition: A | Discharge status: Home / Self Care | Payer: Medicare Other | Attending: Emergency Medicine | Admitting: Emergency Medicine

## 2016-08-04 ENCOUNTER — Encounter (HOSPITAL_COMMUNITY): Payer: Self-pay | Admitting: *Deleted

## 2016-08-04 DIAGNOSIS — Y939 Activity, unspecified: Secondary | ICD-10-CM | POA: Insufficient documentation

## 2016-08-04 DIAGNOSIS — S161XXA Strain of muscle, fascia and tendon at neck level, initial encounter: Secondary | ICD-10-CM | POA: Diagnosis not present

## 2016-08-04 DIAGNOSIS — Y92410 Unspecified street and highway as the place of occurrence of the external cause: Secondary | ICD-10-CM | POA: Diagnosis not present

## 2016-08-04 DIAGNOSIS — S199XXA Unspecified injury of neck, initial encounter: Secondary | ICD-10-CM | POA: Diagnosis present

## 2016-08-04 DIAGNOSIS — Y999 Unspecified external cause status: Secondary | ICD-10-CM | POA: Diagnosis not present

## 2016-08-04 MED ORDER — NAPROXEN 500 MG PO TABS: 500.0000 mg | ORAL_TABLET | Freq: Two times a day (BID) | ORAL | 0 refills | Status: DC

## 2016-08-04 MED ORDER — METHOCARBAMOL 500 MG PO TABS: 1000.0000 mg | ORAL_TABLET | Freq: Four times a day (QID) | ORAL | 0 refills | Status: DC
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
## 2016-08-04 NOTE — ED Notes (Signed)
Papers and medications reviewed with patient. She verbalizes understanding

## 2016-08-05 ENCOUNTER — Emergency Department (HOSPITAL_COMMUNITY): Payer: Medicare Other

## 2016-08-05 ENCOUNTER — Encounter (HOSPITAL_COMMUNITY): Payer: Self-pay

## 2016-08-05 ENCOUNTER — Emergency Department (HOSPITAL_COMMUNITY)
Admission: EM | Admit: 2016-08-05 | Discharge: 2016-08-05 | Disposition: A | Discharge status: Home / Self Care | Payer: Medicare Other | Attending: Emergency Medicine | Admitting: Emergency Medicine

## 2016-08-05 DIAGNOSIS — M7918 Myalgia, other site: Secondary | ICD-10-CM

## 2016-08-05 DIAGNOSIS — M791 Myalgia: Secondary | ICD-10-CM | POA: Diagnosis not present

## 2016-08-05 DIAGNOSIS — Y999 Unspecified external cause status: Secondary | ICD-10-CM | POA: Diagnosis not present

## 2016-08-05 DIAGNOSIS — S199XXA Unspecified injury of neck, initial encounter: Secondary | ICD-10-CM | POA: Insufficient documentation

## 2016-08-05 DIAGNOSIS — Y939 Activity, unspecified: Secondary | ICD-10-CM | POA: Insufficient documentation

## 2016-08-05 DIAGNOSIS — Y92410 Unspecified street and highway as the place of occurrence of the external cause: Secondary | ICD-10-CM | POA: Diagnosis not present

## 2016-08-05 MED ORDER — IBUPROFEN 400 MG PO TABS
800.0000 mg | ORAL_TABLET | Freq: Once | ORAL | Status: AC
  Administered 2016-08-05: 800 mg via ORAL
  Filled 2016-08-05: qty 2

## 2016-08-05 MED ORDER — ACETAMINOPHEN 325 MG PO TABS
650.0000 mg | ORAL_TABLET | Freq: Once | ORAL | Status: AC
  Administered 2016-08-05: 650 mg via ORAL
  Filled 2016-08-05: qty 2

## 2016-08-05 MED ORDER — OXYCODONE-ACETAMINOPHEN 5-325 MG PO TABS: 1.0000 | ORAL_TABLET | Freq: Three times a day (TID) | ORAL | 0 refills | Status: DC | PRN
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
## 2016-09-16 ENCOUNTER — Other Ambulatory Visit: Payer: Medicare Other | Admitting: Adult Health

## 2016-09-17 ENCOUNTER — Telehealth: Payer: Self-pay | Admitting: Women's Health

## 2016-09-17 MED ORDER — MEGESTROL ACETATE 40 MG PO TABS: ORAL_TABLET | ORAL | 0 refills | Status: DC

## 2016-09-20 NOTE — Telephone Encounter (Signed)
Pt informed of Megace sent to pharmacy, pt is not taking birthcontrol pills.  Pt will keep her appointment on 1/29 for annual exam and appointment for 1/25 with Kim for bleeding will be canceled.  Pt verbalized understanding.

## 2016-09-23 ENCOUNTER — Ambulatory Visit: Payer: Medicare Other | Admitting: Women's Health

## 2016-09-27 ENCOUNTER — Other Ambulatory Visit: Payer: Medicare Other | Admitting: Adult Health

## 2016-10-05 ENCOUNTER — Other Ambulatory Visit: Payer: Medicare Other | Admitting: Adult Health

## 2016-10-19 ENCOUNTER — Emergency Department (HOSPITAL_COMMUNITY)
Admission: EM | Admit: 2016-10-19 | Discharge: 2016-10-19 | Disposition: A | Discharge status: Home / Self Care | Payer: Medicare Other | Attending: Emergency Medicine | Admitting: Emergency Medicine

## 2016-10-19 ENCOUNTER — Emergency Department (HOSPITAL_COMMUNITY): Payer: Medicare Other

## 2016-10-19 ENCOUNTER — Encounter (HOSPITAL_COMMUNITY): Payer: Self-pay

## 2016-10-19 DIAGNOSIS — Y939 Activity, unspecified: Secondary | ICD-10-CM | POA: Diagnosis not present

## 2016-10-19 DIAGNOSIS — M25562 Pain in left knee: Secondary | ICD-10-CM

## 2016-10-19 DIAGNOSIS — Y92410 Unspecified street and highway as the place of occurrence of the external cause: Secondary | ICD-10-CM | POA: Diagnosis not present

## 2016-10-19 DIAGNOSIS — Y999 Unspecified external cause status: Secondary | ICD-10-CM | POA: Diagnosis not present

## 2016-10-19 DIAGNOSIS — S8992XA Unspecified injury of left lower leg, initial encounter: Secondary | ICD-10-CM | POA: Insufficient documentation

## 2016-10-19 DIAGNOSIS — M7989 Other specified soft tissue disorders: Secondary | ICD-10-CM | POA: Diagnosis not present

## 2016-10-19 MED ORDER — IBUPROFEN 800 MG PO TABS: 800.0000 mg | ORAL_TABLET | Freq: Three times a day (TID) | ORAL | 0 refills | Status: DC
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
## 2016-10-19 NOTE — ED Notes (Signed)
Got patient ready for discharge 

## 2016-10-25 DIAGNOSIS — R030 Elevated blood-pressure reading, without diagnosis of hypertension: Secondary | ICD-10-CM | POA: Diagnosis not present

## 2016-10-25 DIAGNOSIS — Z833 Family history of diabetes mellitus: Secondary | ICD-10-CM | POA: Diagnosis not present

## 2016-10-25 DIAGNOSIS — N921 Excessive and frequent menstruation with irregular cycle: Secondary | ICD-10-CM | POA: Diagnosis not present

## 2017-02-24 ENCOUNTER — Telehealth: Payer: Self-pay | Admitting: Adult Health

## 2017-02-24 NOTE — Telephone Encounter (Signed)
Pt called requesting a refill on megace. I informed pt that she had not been seen by a provider since Oct 2017 and in order to get a refill she would need to be seen. I offered to connect pt to scheduling but she declined.

## 2017-02-24 NOTE — Telephone Encounter (Signed)
Pt called stating that she would like either Victorino DikeJennifer or Selena BattenKim to give her a call back because she would like  MEGACE called in. Please contact pt

## 2017-03-29 ENCOUNTER — Encounter: Payer: Self-pay | Admitting: *Deleted

## 2017-03-29 ENCOUNTER — Ambulatory Visit: Payer: Medicare Other | Admitting: Adult Health

## 2017-05-11 ENCOUNTER — Telehealth: Payer: Self-pay | Admitting: *Deleted

## 2017-05-11 NOTE — Telephone Encounter (Signed)
Pt has bleeding requesting megace, told her she, has to be seen, but she does not want appts when offered told her she could go to urgent care if needed.she says she works all time and is in [place], she said she [place] doctor in [place]

## 2017-06-03 ENCOUNTER — Encounter (HOSPITAL_COMMUNITY): Payer: Self-pay | Admitting: Emergency Medicine

## 2017-06-03 ENCOUNTER — Ambulatory Visit (HOSPITAL_COMMUNITY)
Admission: EM | Admit: 2017-06-03 | Discharge: 2017-06-03 | Disposition: A | Discharge status: Home / Self Care | Payer: Medicare Other | Attending: Internal Medicine | Admitting: Internal Medicine

## 2017-06-03 DIAGNOSIS — Z3202 Encounter for pregnancy test, result negative: Secondary | ICD-10-CM

## 2017-06-03 DIAGNOSIS — E669 Obesity, unspecified: Secondary | ICD-10-CM | POA: Diagnosis not present

## 2017-06-03 DIAGNOSIS — Z202 Contact with and (suspected) exposure to infections with a predominantly sexual mode of transmission: Secondary | ICD-10-CM | POA: Insufficient documentation

## 2017-06-03 DIAGNOSIS — R7309 Other abnormal glucose: Secondary | ICD-10-CM

## 2017-06-03 DIAGNOSIS — R81 Glycosuria: Secondary | ICD-10-CM | POA: Diagnosis not present

## 2017-06-03 DIAGNOSIS — R030 Elevated blood-pressure reading, without diagnosis of hypertension: Secondary | ICD-10-CM

## 2017-06-03 LAB — POCT URINALYSIS DIP (DEVICE)
Bilirubin Urine: NEGATIVE
Glucose, UA: >=1000 mg/dL — AB
Hgb urine dipstick: NEGATIVE
Ketones, ur: NEGATIVE mg/dL
Nitrite: NEGATIVE
Protein, ur: NEGATIVE mg/dL
Specific Gravity, Urine: 1.02 (ref 1.005–1.030)
Urobilinogen, UA: 1 mg/dL (ref 0.0–1.0)
pH: 7 (ref 5.0–8.0)

## 2017-06-03 LAB — POCT PREGNANCY, URINE: Preg Test, Ur: NEGATIVE

## 2017-06-03 LAB — GLUCOSE, CAPILLARY: Glucose-Capillary: 192 mg/dL — ABNORMAL HIGH (ref 65–99)

## 2017-06-03 MED ORDER — AZITHROMYCIN 250 MG PO TABS
ORAL_TABLET | ORAL | Status: AC
  Filled 2017-06-03: qty 4

## 2017-06-03 MED ORDER — AZITHROMYCIN 250 MG PO TABS
1000.0000 mg | ORAL_TABLET | Freq: Once | ORAL | Status: AC
  Administered 2017-06-03: 1000 mg via ORAL

## 2017-06-03 NOTE — ED Provider Notes (Signed)
[facility]    CSN: [id number] Arrival date & time: 06/03/17  1001     History   Chief Complaint Chief Complaint  Patient presents with  . Exposure to STD    HPI Caroline Yoder is a 26 y.o. female.   26 year old female presents with potential exposure to STD's. She has one current partner who told her he was being treated for Chlamydia and Gonorrhea. She denies any fever, abdominal pain, back pain, dysuria, vaginal discharge or pelvic pain. She did have some vaginal itching a few weeks ago but has resolved. Currently not on any medication. Does not use condoms. Has history of positive Chlamydia and Trichomoniasis in the past few years. Also has history of irregular periods and has taken Megace before. Has routine appointment with her PCP in 3 days since her BP has been high in the past and her GYN requests PCP evaluation before resuming any contraception or Megace Rx.    The history is provided by the patient.    Past Medical History:  Diagnosis Date  . Abnormal uterine bleeding (AUB) 05/23/2015  . Contraceptive management 05/23/2015  . Hx of chlamydia infection   . Menorrhagia 02/11/2014  . Obesity   . Trichimoniasis 05/23/2015  . Vaginal discharge 05/23/2015    Patient Active Problem List   Diagnosis Date Noted  . BV (bacterial vaginosis) 08/19/2015  . Abnormal uterine bleeding (AUB) 05/23/2015  . Vaginal discharge 05/23/2015  . Trichimoniasis 05/23/2015  . Contraceptive management 05/23/2015  . Menorrhagia 02/11/2014  . Hx of chlamydia infection 12/28/2012    History reviewed. No pertinent surgical history.  OB History    Gravida Para Term Preterm AB Living   0             SAB TAB Ectopic Multiple Live Births                   Home Medications    Prior to Admission medications   Medication Sig Start Date End Date Taking? Authorizing Provider  megestrol (MEGACE) 40 MG tablet 3x5d, 2x5d, then 1 daily to help control vaginal bleeding. Stop taking  when bleeding stops. Do not take with birth control pills 09/17/16   [name], CNM    Family History Family History  Problem Relation Age of Onset  . Diabetes Mother   . Thyroid disease Mother   . Thyroid disease Brother   . Diabetes Brother   . Thyroid disease Brother   . Seizures Brother   . Diabetes Brother   . Hypertension Maternal Grandmother   . Diabetes Other     Social History Social History  Substance Use Topics  . Smoking status: Never Smoker  . Smokeless tobacco: Never Used  . Alcohol use No     Allergies   Strawberry flavor   Review of Systems Review of Systems  Constitutional: Negative for activity change, appetite change, chills, fatigue and fever.  HENT: Negative for mouth sores, sore throat and trouble swallowing.   Gastrointestinal: Negative for abdominal pain, blood in stool, constipation, diarrhea, nausea and vomiting.  Endocrine: Negative for polyphagia and polyuria.  Genitourinary: Positive for menstrual problem. Negative for decreased urine volume, difficulty urinating, dyspareunia, dysuria, flank pain, frequency, genital sores, hematuria, pelvic pain, urgency, vaginal bleeding, vaginal discharge and vaginal pain.  Musculoskeletal: Negative for arthralgias, back pain and myalgias.  Skin: Negative for rash and wound.  Neurological: Negative for dizziness, seizures, syncope, weakness, light-headedness, numbness and headaches.  Hematological: Negative for  adenopathy. Does not bruise/bleed easily.     Physical Exam Triage Vital Signs ED Triage Vitals [06/03/17 1018]  Enc Vitals Group     BP 126/84     Pulse Rate 88     Resp 20     Temp 98.2 F (36.8 C)     Temp Source Oral     SpO2 99 %     Weight      Height      Head Circumference      Peak Flow      Pain Score      Pain Loc      Pain Edu?      Excl. in GC?    No data found.   Updated Vital Signs BP 126/84 (BP Location: Left Wrist)   Pulse 88   Temp 98.2 F (36.8 C)  (Oral)   Resp 20   LMP 05/14/2017   SpO2 99%   Visual Acuity Right Eye Distance:   Left Eye Distance:   Bilateral Distance:    Right Eye Near:   Left Eye Near:    Bilateral Near:     Physical Exam  Constitutional: She is oriented to person, place, and time. She appears well-developed and well-nourished. No distress.  HENT:  Head: Normocephalic and atraumatic.  Right Ear: External ear normal.  Left Ear: External ear normal.  Nose: Nose normal.  Eyes: Conjunctivae and EOM are normal.  Neck: Normal range of motion.  Cardiovascular: Normal rate, regular rhythm and normal heart sounds.   No murmur heard. Pulmonary/Chest: Effort normal and breath sounds normal. No respiratory distress. She has no decreased breath sounds. She has no wheezes. She has no rhonchi.  Abdominal: Soft. Bowel sounds are normal. There is no tenderness. There is no rigidity, no rebound, no guarding and no CVA tenderness.  Difficult to exam due to body habitus- obese  Genitourinary:  Genitourinary Comments: Declined pelvic exam  Musculoskeletal: Normal range of motion.  Neurological: She is alert and oriented to person, place, and time.  Skin: Skin is warm and dry. No rash noted.  Psychiatric: Her speech is normal and behavior is normal. Thought content normal. Her mood appears anxious.     UC Treatments / Results  Labs (all labs ordered are listed, but only abnormal results are displayed) Labs Reviewed  GLUCOSE, CAPILLARY - Abnormal; Notable for the following:       Result Value   Glucose-Capillary 192 (*)    All other components within normal limits  POCT URINALYSIS DIP (DEVICE) - Abnormal; Notable for the following:    Glucose, UA >=1000 (*)    Leukocytes, UA TRACE (*)    All other components within normal limits  POCT PREGNANCY, URINE  URINE CYTOLOGY ANCILLARY ONLY    EKG  EKG Interpretation None       Radiology No results found.  Procedures Procedures (including critical care  time)  Medications Ordered in UC Medications  azithromycin (ZITHROMAX) tablet 1,000 mg (1,000 mg Oral Given 06/03/17 1057)     Initial Impression / Assessment and Plan / UC Course  I have reviewed the triage vital signs and the nursing notes.  Pertinent labs & imaging results that were available during my care of the patient were reviewed by me and considered in my medical decision making (see chart for details).    Reviewed urinalysis results with patient- glucose over 1000. Performed finger stick random glucose = 192. Discussed that these results are high and that she may  have diabetes. She indicated that she will be evaluated further with additional fasting labwork on Monday (3 days) with her PCP. Her mother and 2 brothers have diabetes.  Discussed that the treatment for Chlamydia and Gonorrhea is oral pills and a shot. She insists that she is not receiving an injection today because "I get so anxious that it elevates by blood pressure and I end up in the ER". Reviewed with patient that we will treat for Chlamydia today but she will need to return for an injection if she is positive for Gonorrhea. Patient understands and agrees. Gave Zithromax 1g orally today. Discussed no sexual intercourse for at least 7 days. Recommend condom use each and every sexual encounter. Follow-up pending lab results and in 3 days with her PCP as planned.   Final Clinical Impressions(s) / UC Diagnoses   Final diagnoses:  Exposure to STD  Elevated serum glucose with glucosuria    New Prescriptions Discharge Medication List as of 06/03/2017 11:21 AM       Controlled Substance Prescriptions [place] Controlled Substance Registry consulted? Not Applicable   Sudie Grumbling, NP 06/04/17 0009

## 2017-06-03 NOTE — ED Triage Notes (Signed)
Pt here for STD check... sts partner has told here he is being treated for Gon/Chlam  Pt is asymptomatic today but did report some vag itching a "few" days ago  Denies fevers, chills, urinary sx, vag d/c, abd pain  A&O x4... NAD.Marland Kitchen. ambulatory

## 2017-06-03 NOTE — Discharge Instructions (Signed)
You were given Zithromax (antibiotic) today to treat Chlamydia. You refused the shot for Gonorrhea today. If your results come back positive, you will need to come in to get a Rocephin shot. Recommend no sexual intercourse for at least 7 days.  Your sugar was also elevated in your blood and your urine- follow-up with your PCP on Monday as planned for further evaluation. Follow-up here pending lab results.

## 2017-06-06 ENCOUNTER — Telehealth (HOSPITAL_COMMUNITY): Payer: Self-pay | Admitting: Internal Medicine

## 2017-06-06 LAB — URINE CYTOLOGY ANCILLARY ONLY
Chlamydia: NEGATIVE
Neisseria Gonorrhea: NEGATIVE
Trichomonas: POSITIVE — AB

## 2017-06-06 MED ORDER — METRONIDAZOLE 500 MG PO TABS: 500.0000 mg | ORAL_TABLET | Freq: Two times a day (BID) | ORAL | 0 refills | Status: DC

## 2017-06-06 NOTE — Telephone Encounter (Signed)
Clinical staff please let patient know that test for trichomonas was positive.  Rx for metronidazole was sent to the pharmacy of record, [facility] on [address] at [employer] in [place].  Please refrain from sexual intercourse for 7 days to give the medicine time to work.  Sexual partners need to be notified and tested/treated.  Condoms may reduce risk of reinfection. Tests for bacterial vaginosis and candida are still pending.    Recheck or followup with PCP for further evaluation if symptoms are not improving.   LM

## 2017-06-10 LAB — URINE CYTOLOGY ANCILLARY ONLY: Candida vaginitis: POSITIVE — AB

## 2017-06-11 ENCOUNTER — Telehealth (HOSPITAL_COMMUNITY): Payer: Self-pay | Admitting: Internal Medicine

## 2017-06-11 MED ORDER — FLUCONAZOLE 150 MG PO TABS: 150.0000 mg | ORAL_TABLET | Freq: Once | ORAL | 0 refills | Status: AC

## 2017-06-11 NOTE — Telephone Encounter (Signed)
Please let patient know that of late results,  test for gardnerella (bacterial vaginosis) was positive.   This only needs to be treated if there are symptoms, such as vaginal irritation/discharge.  Rx for metronidazole was previously sent to the pharmacy for positive trichomonas test, and will cover bacterial vaginosis.   Test for candida (yeast) was also positive.  Rx fluconazole was sent to the pharmacy of record, [facility] in [place]. Recheck or followup with your primary care provider for further evaluation if symptoms are not improving.  Ria Clock MD

## 2017-08-01 DIAGNOSIS — M545 Low back pain: Secondary | ICD-10-CM | POA: Diagnosis not present

## 2017-09-01 ENCOUNTER — Encounter (HOSPITAL_COMMUNITY): Payer: Self-pay | Admitting: *Deleted

## 2017-09-01 ENCOUNTER — Other Ambulatory Visit: Payer: Self-pay

## 2017-09-01 ENCOUNTER — Emergency Department (HOSPITAL_COMMUNITY)
Admission: EM | Admit: 2017-09-01 | Discharge: 2017-09-01 | Disposition: A | Discharge status: Home / Self Care | Payer: Medicare HMO | Attending: Emergency Medicine | Admitting: Emergency Medicine

## 2017-09-01 DIAGNOSIS — K0889 Other specified disorders of teeth and supporting structures: Secondary | ICD-10-CM | POA: Diagnosis not present

## 2017-09-01 MED ORDER — BUPIVACAINE-EPINEPHRINE (PF) 0.5% -1:200000 IJ SOLN: 1.8000 mL | Freq: Once | INTRAMUSCULAR | Status: DC

## 2017-09-01 MED ORDER — LIDOCAINE VISCOUS 2 % MT SOLN: 20.0000 mL | OROMUCOSAL | 0 refills | Status: DC | PRN

## 2017-09-01 MED ORDER — ACETAMINOPHEN 325 MG PO TABS
650.0000 mg | ORAL_TABLET | Freq: Once | ORAL | Status: AC
  Administered 2017-09-01: 650 mg via ORAL
  Filled 2017-09-01: qty 2

## 2017-09-01 MED ORDER — AMOXICILLIN 500 MG PO CAPS: 500.0000 mg | ORAL_CAPSULE | Freq: Three times a day (TID) | ORAL | 0 refills | Status: DC

## 2017-09-01 NOTE — ED Notes (Signed)
Pt requesting to speak with provider reporting that she needs something stronger for pain.

## 2017-09-01 NOTE — ED Provider Notes (Signed)
[facility] Provider Note   CSN: [id number] Arrival date & time: 09/01/17  [id number]     History   Chief Complaint Chief Complaint  Patient presents with  . Dental Pain    HPI Caroline Yoder is a 27 y.o. female.  HPI 27 year old African-American female with no pertinent past medical history presents to the ED with complaints of dental pain.  The patient states that she chipped her tooth approximately 1 month ago.  States that for 2 days she has had increased pain to the area after eating a piece of hard candy.  She reports that pain radiates from her left upper tooth to the left side of her face into her give her left-sided headache.  States that she is been taking 10 mg oxycodone at home without any relief.  She also states that she has tried ibuprofen without any relief.  She is called around to make a dentist appointment however they cannot see her to the end of this month.  Patient states that the palpation and eating makes the pain worse.  Nothing makes the pain better.  Denies any difficulty breathing or swallowing.  Denies any associated fever, chills, otalgia. Past Medical History:  Diagnosis Date  . Abnormal uterine bleeding (AUB) 05/23/2015  . Contraceptive management 05/23/2015  . Hx of chlamydia infection   . Menorrhagia 02/11/2014  . Obesity   . Trichimoniasis 05/23/2015  . Vaginal discharge 05/23/2015    Patient Active Problem List   Diagnosis Date Noted  . BV (bacterial vaginosis) 08/19/2015  . Abnormal uterine bleeding (AUB) 05/23/2015  . Vaginal discharge 05/23/2015  . Trichimoniasis 05/23/2015  . Contraceptive management 05/23/2015  . Menorrhagia 02/11/2014  . Hx of chlamydia infection 12/28/2012    History reviewed. No pertinent surgical history.  OB History    Gravida Para Term Preterm AB Living   0             SAB TAB Ectopic Multiple Live Births                   Home Medications    Prior to Admission  medications   Medication Sig Start Date End Date Taking? Authorizing Provider  amoxicillin (AMOXIL) 500 MG capsule Take 1 capsule (500 mg total) by mouth 3 (three) times daily. 09/01/17   Rise MuLeaphart, Harald Quevedo T, PA-C  megestrol (MEGACE) 40 MG tablet 3x5d, 2x5d, then 1 daily to help control vaginal bleeding. Stop taking when bleeding stops. Do not take with birth control pills 09/17/16   Cheral MarkerBooker, Kimberly R, CNM  metroNIDAZOLE (FLAGYL) 500 MG tablet Take 1 tablet (500 mg total) by mouth 2 (two) times daily. 06/06/17   Isa RankinMurray, Laura Wilson, MD    Family History Family History  Problem Relation Age of Onset  . Diabetes Mother   . Thyroid disease Mother   . Thyroid disease Brother   . Diabetes Brother   . Thyroid disease Brother   . Seizures Brother   . Diabetes Brother   . Hypertension Maternal Grandmother   . Diabetes Other     Social History Social History   Tobacco Use  . Smoking status: Never Smoker  . Smokeless tobacco: Never Used  Substance Use Topics  . Alcohol use: No  . Drug use: No     Allergies   Strawberry flavor   Review of Systems Review of Systems  Constitutional: Negative for chills and fever.  HENT: Positive for dental problem. Negative for ear pain, sore  throat and trouble swallowing.   Skin: Negative for color change.     Physical Exam Updated Vital Signs BP 127/83 (BP Location: Right Wrist)   Pulse 73   Temp 98.5 F (36.9 C)   Resp 16   LMP 08/30/2017   SpO2 100%   Physical Exam  Constitutional: She appears well-developed and well-nourished. No distress.  HENT:  Head: Normocephalic and atraumatic.  Right Ear: Tympanic membrane, external ear and ear canal normal.  Left Ear: Tympanic membrane, external ear and ear canal normal.  Nose: Nose normal.  Mouth/Throat: Uvula is midline, oropharynx is clear and moist and mucous membranes are normal. No trismus in the jaw. No uvula swelling. No tonsillar exudate.    Patient managing secretions and  tolerating airway.  Speaking in complete sentences.  Oropharynx is clear.  No sublingual or submandibular swelling.  No facial swelling appreciated.  Eyes: Right eye exhibits no discharge. Left eye exhibits no discharge. No scleral icterus.  Neck: Normal range of motion. Neck supple.  Pulmonary/Chest: No respiratory distress.  Musculoskeletal: Normal range of motion.  Lymphadenopathy:    She has no cervical adenopathy.  Neurological: She is alert.  Skin: Skin is warm and dry. Capillary refill takes less than 2 seconds. No pallor.  Psychiatric: Her behavior is normal. Judgment and thought content normal.  Nursing note and vitals reviewed.    ED Treatments / Results  Labs (all labs ordered are listed, but only abnormal results are displayed) Labs Reviewed - No data to display  EKG  EKG Interpretation None       Radiology No results found.  Procedures Procedures (including critical care time)  Medications Ordered in ED Medications  bupivacaine-epinephrine (MARCAINE W/ EPI) 0.5% -1:200000 injection 1.8 mL (not administered)  acetaminophen (TYLENOL) tablet 650 mg (not administered)     Initial Impression / Assessment and Plan / ED Course  I have reviewed the triage vital signs and the nursing notes.  Pertinent labs & imaging results that were available during my care of the patient were reviewed by me and considered in my medical decision making (see chart for details).     Patient with toothache.  No gross abscess.  Exam unconcerning for Ludwig's angina or spread of infection.  I offered patient dental block however she refused.  She states that her 10 mg Percocets at home were not helping the pain.  Discussed alternating Motrin and Tylenol.  Will treat with penicillin and pain medicine.  Urged patient to follow-up with dentist.     On discharge nurse performed the patient was very upset that I would not treat her pain in the ED.  I would like to talk with patient.   Discussed that I offered her a dental block which she refused.  Also gave her Tylenol.  She states that Tylenol does not work and she needs something stronger for pain.  Instructed patient that she took 10 mg of Percocet at home without any relief.  I cannot narcotic pain medications for dental pain.  I stated that the antibiotics will help her pain in 1-2 days and the infection is treated.  States that dental follow-up is important.  I offered patient a lidocaine viscous rinse for her mouth.  I instructed that she needs to continue taking Tylenol and Motrin at home.  She has no signs of sinus infection, otitis media or meningitis.  Pain likely coming from her infected tooth.  Final Clinical Impressions(s) / ED Diagnoses   Final diagnoses:  Pain, dental    ED Discharge Orders        Ordered    amoxicillin (AMOXIL) 500 MG capsule  3 times daily     09/01/17 1039       Rise Mu, PA-C 09/01/17 1044    Rise Mu, PA-C 09/01/17 1059    Donnetta Hutching, MD 09/04/17 1356

## 2017-09-01 NOTE — ED Notes (Signed)
ED Provider at bedside. 

## 2017-09-01 NOTE — ED Triage Notes (Signed)
Pt reports left upper dental pain. Pt states that she knows it is chipped. Pt reports headache as well. Pain has been ongoing for 1 week.

## 2017-09-01 NOTE — Discharge Instructions (Signed)
You have a dental injury/infection. It is very important that you get evaluated by a dentist as soon as possible. Call tomorrow to schedule an appointment. buprofen and tylenol as needed for pain make sure you are alternating these medications and take as directed in the back of the bottle.  Have been given your first dose of Tylenol in the ED.. Take your full course of antibiotics. Read the instructions below.  Eat a soft or liquid diet and rinse your mouth out after meals with warm water. You should see a dentist or return here at once if you have increased swelling, increased pain or uncontrolled bleeding from the site of your injury.  SEEK MEDICAL CARE IF:  You have increased pain not controlled with medicines.  You have swelling around your tooth, in your face or neck.  You have bleeding which starts, continues, or gets worse.  You have a fever >101 If you are unable to open your mouth

## 2017-10-05 DIAGNOSIS — H52223 Regular astigmatism, bilateral: Secondary | ICD-10-CM | POA: Diagnosis not present

## 2017-10-05 DIAGNOSIS — Z0100 Encounter for examination of eyes and vision without abnormal findings: Secondary | ICD-10-CM | POA: Diagnosis not present

## 2018-01-11 ENCOUNTER — Ambulatory Visit: Payer: Medicare Other | Admitting: Adult Health

## 2018-01-11 ENCOUNTER — Encounter: Payer: Self-pay | Admitting: Adult Health

## 2018-01-11 VITALS — BP 130/80 | HR 100 | Ht 65.0 in | Wt 379.2 lb

## 2018-01-11 DIAGNOSIS — Z3202 Encounter for pregnancy test, result negative: Secondary | ICD-10-CM | POA: Diagnosis not present

## 2018-01-11 DIAGNOSIS — Z113 Encounter for screening for infections with a predominantly sexual mode of transmission: Secondary | ICD-10-CM | POA: Insufficient documentation

## 2018-01-11 DIAGNOSIS — N921 Excessive and frequent menstruation with irregular cycle: Secondary | ICD-10-CM

## 2018-01-11 LAB — POCT URINE PREGNANCY: Preg Test, Ur: NEGATIVE

## 2018-01-11 MED ORDER — NORGESTIMATE-ETH ESTRADIOL 0.25-35 MG-MCG PO TABS: 1.0000 | ORAL_TABLET | Freq: Every day | ORAL | 11 refills | Status: DC

## 2018-01-11 MED ORDER — METFORMIN HCL 500 MG PO TABS: 500.0000 mg | ORAL_TABLET | Freq: Two times a day (BID) | ORAL | 3 refills | Status: DC

## 2018-01-11 NOTE — Progress Notes (Signed)
  Subjective:     Patient ID: Caroline Yoder, female   DOB: 05/13/1991, 27 y.o.   MRN: [id number]  HPI Caroline Yoder is a 27 year old black female in complaining of bleeding for a month, some clots and skipping periods for like 3 months at times.  PCP is Dr Sudie Bailey.   Review of Systems Bleeding for a month Skipping periods for like 3 months Some clots Is sexually active with condoms Reviewed past medical,surgical, social and family history. Reviewed medications and allergies.      Objective:   Physical Exam BP 130/80 (BP Location: Left Arm, Patient Position: Sitting, Cuff Size: Large)   Pulse 100   Ht  (1.651 m)   Wt (!) 379 lb 3.2 oz (172 kg)   LMP 11/28/2017 (Approximate)   BMI 63.10 kg/m UPT negative. Skin warm and dry.Pelvic: external genitalia is normal in appearance no lesions, vagina: white discharge with odor,urethra has no lesions or masses noted, cervix:smooth, uterus: normal size, Yoder and contour, non tender, no masses felt, adnexa: no masses or tenderness noted. Bladder is non tender and no masses felt.  GC/CHL obtained.  Will check CBC and CMP today and start on sprintec and metformin.     Assessment:     1. Menorrhagia with irregular cycle   2. Pregnancy examination or test, negative result   3. Screening examination for STD (sexually transmitted disease)   4. Morbid obesity (HCC)       Plan:     GC/CHL sent Check CBC and CMP Take 1 metformin daily for 1 week then go to bid Start sprintec today Use condoms Try to lose some weight Meds ordered this encounter  Medications  . metFORMIN (GLUCOPHAGE) 500 MG tablet    Sig: Take 1 tablet (500 mg total) by mouth 2 (two) times daily with a meal.    Dispense:  60 tablet    Refill:  3    Order Specific Question:   Supervising Provider    Answer:   Despina Hidden, LUTHER H [[id number]]  . norgestimate-ethinyl estradiol (ORTHO-CYCLEN,SPRINTEC,PREVIFEM) 0.25-35 MG-MCG tablet    Sig: Take 1 tablet by mouth daily.    Dispense:   1 Package    Refill:  11    Order Specific Question:   Supervising Provider    Answer:   Lazaro Arms [[id number]]  F/U in June as scheduled

## 2018-01-13 LAB — GC/CHLAMYDIA PROBE AMP
Chlamydia trachomatis, NAA: NEGATIVE
Neisseria gonorrhoeae by PCR: NEGATIVE

## 2018-02-02 ENCOUNTER — Other Ambulatory Visit: Payer: Medicare Other | Admitting: Women's Health

## 2018-02-27 ENCOUNTER — Encounter: Payer: Self-pay | Admitting: *Deleted

## 2018-02-27 ENCOUNTER — Other Ambulatory Visit: Payer: Medicare HMO | Admitting: Women's Health
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
## 2019-09-03 ENCOUNTER — Ambulatory Visit (INDEPENDENT_AMBULATORY_CARE_PROVIDER_SITE_OTHER): Payer: Medicare HMO | Admitting: Adult Health

## 2019-09-03 ENCOUNTER — Other Ambulatory Visit: Payer: Self-pay

## 2019-09-03 ENCOUNTER — Encounter: Payer: Self-pay | Admitting: Adult Health

## 2019-09-03 VITALS — BP 137/80 | HR 101 | Ht 63.0 in | Wt 376.2 lb

## 2019-09-03 DIAGNOSIS — N939 Abnormal uterine and vaginal bleeding, unspecified: Secondary | ICD-10-CM | POA: Diagnosis not present

## 2019-09-03 MED ORDER — MEGESTROL ACETATE 40 MG PO TABS: ORAL_TABLET | ORAL | 1 refills | Status: DC

## 2019-09-03 NOTE — Progress Notes (Signed)
  Subjective:     Patient ID: Caroline Yoder, female   DOB: 1991-07-29, 29 y.o.   MRN: [id number]  HPI Ting is a 29 year old black female,single, G0P0, in complaining of bleeding since 08/02/2019, has clots no cramps or pain, last sex over 6 months ago. PCP is Dr Sudie Bailey  Review of Systems Bleeding since 08/02/2019 +clots Denies any cramps or pain Last sex over 6 months ago Denies any headaches, dizziness or shortness of breath   Reviewed past medical,surgical, social and family history. Reviewed medications and allergies.     Objective:   Physical Exam BP 137/80 (BP Location: Right Arm, Patient Position: Sitting, Cuff Size: Normal)   Pulse (!) 101   Ht 5\' 3"  (1.6 m)   Wt (!) 376 lb 3.2 oz (170.6 kg)   LMP 08/02/2019   BMI 66.64 kg/m   Skin warm and dry. Neck: mid line trachea, normal thyroid, good ROM, no lymphadenopathy noted. Lungs: clear to ausculation bilaterally. Cardiovascular: regular rate and rhythm. Pelvic: external genitalia is normal in appearance no lesions, vagina: +blood and clots, dark,urethra has no lesions or masses noted, cervix:smooth, uterus: normal size, Yoder and contour, non tender, no masses felt, adnexa: no masses or tenderness noted. Bladder is non tender and no masses felt.    Fall risk is low Examination chaperoned by 14/10/2018 LPN.  Assessment:     1. Abnormal uterine bleeding (AUB) -Check CBC and CMP -Will get GYN Malachy Mood in about 2 weeks -Will rx megace to stop bleeding Meds ordered this encounter  Medications  . megestrol (MEGACE) 40 MG tablet    Sig: Take 3 x 5 days then 2 x 5 days then 1 daily    Dispense:  45 tablet    Refill:  1    Order Specific Question:   Supervising Provider    Answer:   [place] [[id number]]   -Review handout on AUB   2. Morbid obesity (HCC)     Plan:    Check CBC and CMP Will rx megace to stop bleeding  Will get GYN Lazaro Arms to assess uterus and ovaries  Will talk when [place] results back

## 2019-09-03 NOTE — Patient Instructions (Signed)
Abnormal Uterine Bleeding °Abnormal uterine bleeding means bleeding more than usual from your uterus. It can include: °· Bleeding between periods. °· Bleeding after sex. °· Bleeding that is heavier than normal. °· Periods that last longer than usual. °· Bleeding after you have stopped having your period (menopause). °There are many problems that may cause this. You should see a doctor for any kind of bleeding that is not normal. Treatment depends on the cause of the bleeding. °Follow these instructions at home: °· Watch your condition for any changes. °· Do not use tampons, douche, or have sex, if your doctor tells you not to. °· Change your pads often. °· Get regular well-woman exams. Make sure they include a pelvic exam and cervical cancer screening. °· Keep all follow-up visits as told by your doctor. This is important. °Contact a doctor if: °· The bleeding lasts more than one week. °· You feel dizzy at times. °· You feel like you are going to throw up (nauseous). °· You throw up. °Get help right away if: °· You pass out. °· You have to change pads every hour. °· You have belly (abdominal) pain. °· You have a fever. °· You get sweaty. °· You get weak. °· You passing large blood clots from your vagina. °Summary °· Abnormal uterine bleeding means bleeding more than usual from your uterus. °· There are many problems that may cause this. You should see a doctor for any kind of bleeding that is not normal. °· Treatment depends on the cause of the bleeding. °This information is not intended to replace advice given to you by your health care provider. Make sure you discuss any questions you have with your health care provider. °Document Revised: 08/10/2016 Document Reviewed: 08/10/2016 °Elsevier Patient Education © 2020 [employer] Inc[employer] ° °

## 2019-09-20 ENCOUNTER — Other Ambulatory Visit: Payer: Medicare Other

## 2019-09-21 ENCOUNTER — Other Ambulatory Visit: Payer: Medicare Other

## 2019-09-27 ENCOUNTER — Other Ambulatory Visit: Payer: Medicare Other

## 2019-10-11 ENCOUNTER — Other Ambulatory Visit: Payer: Medicare Other

## 2020-06-02 ENCOUNTER — Ambulatory Visit: Payer: Medicare HMO | Admitting: Adult Health

## 2020-06-10 DIAGNOSIS — J309 Allergic rhinitis, unspecified: Secondary | ICD-10-CM | POA: Diagnosis not present

## 2020-06-10 DIAGNOSIS — G4700 Insomnia, unspecified: Secondary | ICD-10-CM | POA: Diagnosis not present

## 2020-06-16 DIAGNOSIS — J309 Allergic rhinitis, unspecified: Secondary | ICD-10-CM | POA: Diagnosis not present

## 2020-06-16 DIAGNOSIS — F900 Attention-deficit hyperactivity disorder, predominantly inattentive type: Secondary | ICD-10-CM | POA: Diagnosis not present

## 2020-06-16 DIAGNOSIS — G4700 Insomnia, unspecified: Secondary | ICD-10-CM | POA: Diagnosis not present

## 2020-06-16 DIAGNOSIS — R030 Elevated blood-pressure reading, without diagnosis of hypertension: Secondary | ICD-10-CM | POA: Diagnosis not present

## 2020-06-16 DIAGNOSIS — Z833 Family history of diabetes mellitus: Secondary | ICD-10-CM | POA: Diagnosis not present

## 2020-07-07 DIAGNOSIS — Z029 Encounter for administrative examinations, unspecified: Secondary | ICD-10-CM

## 2020-09-30 ENCOUNTER — Other Ambulatory Visit: Payer: Medicare HMO

## 2020-09-30 DIAGNOSIS — Z20822 Contact with and (suspected) exposure to covid-19: Secondary | ICD-10-CM

## 2020-10-01 LAB — NOVEL CORONAVIRUS, NAA: SARS-CoV-2, NAA: NOT DETECTED

## 2020-10-01 LAB — SARS-COV-2, NAA 2 DAY TAT

## 2020-10-01 LAB — SPECIMEN STATUS REPORT

## 2020-12-10 ENCOUNTER — Emergency Department (HOSPITAL_COMMUNITY)
Admission: EM | Admit: 2020-12-10 | Discharge: 2020-12-10 | Disposition: A | Discharge status: Home / Self Care | Payer: Medicare HMO | Attending: Emergency Medicine | Admitting: Emergency Medicine

## 2020-12-10 ENCOUNTER — Other Ambulatory Visit: Payer: Self-pay

## 2020-12-10 DIAGNOSIS — R202 Paresthesia of skin: Secondary | ICD-10-CM | POA: Diagnosis present

## 2020-12-10 MED ORDER — IBUPROFEN 800 MG PO TABS: 800.0000 mg | ORAL_TABLET | Freq: Three times a day (TID) | ORAL | 0 refills | Status: DC

## 2020-12-10 NOTE — ED Triage Notes (Signed)
Pt c/o of left great toe numbness that started two days ago.

## 2020-12-10 NOTE — ED Provider Notes (Signed)
[facility] EMERGENCY DEPARTMENT Provider Note   CSN: [id number] Arrival date & time: 12/10/20  6606     History Chief Complaint  Patient presents with  . Numbness    Numbness to left great toe     Caroline Yoder is a 30 y.o. female.  HPI      Caroline Yoder is a 30 y.o. female who presents to the Emergency Department complaining of pins-and-needles sensation of her left great toe.  Symptoms began 2 days ago.  She describes feeling a stinging numb sensation to the plantar surface of the great toe that extends to the arch of her foot. Numbness does not radiate into her ankle or leg.   She states that she does stand all day at her job working for [employer].  She admits to wearing crocs or standing barefoot at her job.  She denies any known injury, redness, swelling of her foot or weakness.   Nothing makes her symptoms better or worse.    Past Medical History:  Diagnosis Date  . Abnormal uterine bleeding (AUB) 05/23/2015  . Contraceptive management 05/23/2015  . Hx of chlamydia infection   . Menorrhagia 02/11/2014  . Obesity   . Trichimoniasis 05/23/2015  . Vaginal discharge 05/23/2015    Patient Active Problem List   Diagnosis Date Noted  . Morbid obesity (HCC) 01/11/2018  . Screening examination for STD (sexually transmitted disease) 01/11/2018  . Pregnancy examination or test, negative result 01/11/2018  . BV (bacterial vaginosis) 08/19/2015  . Abnormal uterine bleeding (AUB) 05/23/2015  . Vaginal discharge 05/23/2015  . Trichimoniasis 05/23/2015  . Contraceptive management 05/23/2015  . Menorrhagia 02/11/2014  . Hx of chlamydia infection 12/28/2012    No past surgical history on file.   OB History    Gravida  0   Para      Term      Preterm      AB      Living        SAB      IAB      Ectopic      Multiple      Live Births              Family History  Problem Relation Age of Onset  . Diabetes Mother   . Thyroid disease Mother   . Thyroid  disease Brother   . Diabetes Brother   . Thyroid disease Brother   . Seizures Brother   . Diabetes Brother   . Hypertension Maternal Grandmother   . Diabetes Other     Social History   Tobacco Use  . Smoking status: Never Smoker  . Smokeless tobacco: Never Used  Vaping Use  . Vaping Use: Never used  Substance Use Topics  . Alcohol use: No  . Drug use: No    Home Medications Prior to Admission medications   Medication Sig Start Date End Date Taking? Authorizing Provider  megestrol (MEGACE) 40 MG tablet Take 3 x 5 days then 2 x 5 days then 1 daily 09/03/19   Adline Potter, NP    Allergies    Strawberry flavor  Review of Systems   Review of Systems  Constitutional: Negative for chills and fever.  Gastrointestinal: Negative for abdominal pain, nausea and vomiting.  Musculoskeletal: Negative for arthralgias, back pain and joint swelling.  Skin: Negative for color change and wound.  Neurological: Positive for numbness (numbness left great toe). Negative for weakness and headaches.    Physical  Exam Updated Vital Signs BP (!) 162/88   Pulse 89   Temp 98.4 F (36.9 C) (Oral)   Resp 18   Ht 5\' 4"  (1.626 m)   Wt (!) 137 kg   SpO2 99%   BMI 51.84 kg/m   Physical Exam Constitutional:      General: She is not in acute distress.    Appearance: Normal appearance. She is obese.  HENT:     Head: Atraumatic.  Cardiovascular:     Rate and Rhythm: Normal rate and regular rhythm.     Pulses: Normal pulses.  Pulmonary:     Effort: Pulmonary effort is normal.     Breath sounds: Normal breath sounds.  Musculoskeletal:        General: No swelling, tenderness or signs of injury. Normal range of motion.     Comments: No erythema, edema or decreased warmth of the left foot or great toe.  No open wounds or skin changes.  Patient has full range of motion of all the toes of the left foot.  Skin:    General: Skin is warm.     Capillary Refill: Capillary refill takes less than  2 seconds.     Findings: No rash.  Neurological:     General: No focal deficit present.     Mental Status: She is alert.     Sensory: No sensory deficit.     Motor: No weakness.     Comments: Gross sensation of the left great toe and foot intact.     ED Results / Procedures / Treatments   Labs (all labs ordered are listed, but only abnormal results are displayed) Labs Reviewed - No data to display  EKG None  Radiology No results found.  Procedures Procedures   Medications Ordered in ED Medications - No data to display  ED Course  I have reviewed the triage vital signs and the nursing notes.  Pertinent labs & imaging results that were available during my care of the patient were reviewed by me and considered in my medical decision making (see chart for details).    MDM Rules/Calculators/A&P                          Patient with complaint of pins-and-needles sensation to the plantar surface of the left great toe x2 days.  No known injury.  Normal appearing exam of the left foot and toes.  Patient does have tenderness along the midfoot.  Admits to poor footwear and arch support.  Likely cause of her symptoms.  Symptoms are localized to the plantar toe and arch only.  Patient reassured, agrees to wear supportive proper fitting shoes.  NSAID for discomfort.  Agrees to follow-up with podiatry if needed.  Is appropriate for discharge home  The patient appears reasonably screened and/or stabilized for discharge and I doubt any other medical condition or other [facility] requiring further screening, evaluation, or treatment in the ED at this time prior to discharge.   Final Clinical Impression(s) / ED Diagnoses Final diagnoses:  Paresthesia of left foot    Rx / DC Orders ED Discharge Orders    None       [facility], PA-C 12/10/20 1020    12/12/20, MD 12/11/20 1712

## 2020-12-10 NOTE — Discharge Instructions (Signed)
As discussed, try to wear supportive shoes when at work and with standing or walking.  Do not stand barefoot.  Also, try rolling your foot over a cold can or bottle while wearing socks.  You may call the podiatrist listed to arrange a follow-up appointment in 1 week if not improving.

## 2021-01-05 ENCOUNTER — Inpatient Hospital Stay (HOSPITAL_COMMUNITY)
Admission: EM | Admit: 2021-01-05 | Discharge: 2021-01-06 | DRG: 871 | Discharge status: Against Medical Advice | Payer: Medicare HMO | Source: Ambulatory Visit | Attending: Family Medicine | Admitting: Family Medicine

## 2021-01-05 ENCOUNTER — Ambulatory Visit (INDEPENDENT_AMBULATORY_CARE_PROVIDER_SITE_OTHER): Payer: Medicare HMO

## 2021-01-05 ENCOUNTER — Ambulatory Visit
Admission: EM | Admit: 2021-01-05 | Discharge: 2021-01-05 | Disposition: A | Discharge status: Home / Self Care | Payer: Medicare HMO | Attending: Family Medicine | Admitting: Family Medicine

## 2021-01-05 ENCOUNTER — Encounter: Payer: Self-pay | Admitting: Emergency Medicine

## 2021-01-05 ENCOUNTER — Encounter (HOSPITAL_COMMUNITY): Payer: Self-pay | Admitting: *Deleted

## 2021-01-05 ENCOUNTER — Other Ambulatory Visit: Payer: Self-pay

## 2021-01-05 DIAGNOSIS — J1282 Pneumonia due to coronavirus disease 2019: Secondary | ICD-10-CM | POA: Diagnosis present

## 2021-01-05 DIAGNOSIS — Z8249 Family history of ischemic heart disease and other diseases of the circulatory system: Secondary | ICD-10-CM | POA: Diagnosis not present

## 2021-01-05 DIAGNOSIS — U071 COVID-19: Secondary | ICD-10-CM | POA: Diagnosis present

## 2021-01-05 DIAGNOSIS — E7439 Other disorders of intestinal carbohydrate absorption: Secondary | ICD-10-CM | POA: Diagnosis present

## 2021-01-05 DIAGNOSIS — R059 Cough, unspecified: Secondary | ICD-10-CM

## 2021-01-05 DIAGNOSIS — R0602 Shortness of breath: Secondary | ICD-10-CM | POA: Diagnosis not present

## 2021-01-05 DIAGNOSIS — J9601 Acute respiratory failure with hypoxia: Secondary | ICD-10-CM | POA: Diagnosis present

## 2021-01-05 DIAGNOSIS — R000 Tachycardia, unspecified: Secondary | ICD-10-CM

## 2021-01-05 DIAGNOSIS — Z8349 Family history of other endocrine, nutritional and metabolic diseases: Secondary | ICD-10-CM | POA: Diagnosis not present

## 2021-01-05 DIAGNOSIS — E8809 Other disorders of plasma-protein metabolism, not elsewhere classified: Secondary | ICD-10-CM | POA: Diagnosis present

## 2021-01-05 DIAGNOSIS — Z5329 Procedure and treatment not carried out because of patient's decision for other reasons: Secondary | ICD-10-CM | POA: Diagnosis present

## 2021-01-05 DIAGNOSIS — D649 Anemia, unspecified: Secondary | ICD-10-CM | POA: Diagnosis present

## 2021-01-05 DIAGNOSIS — R739 Hyperglycemia, unspecified: Secondary | ICD-10-CM | POA: Diagnosis present

## 2021-01-05 DIAGNOSIS — R030 Elevated blood-pressure reading, without diagnosis of hypertension: Secondary | ICD-10-CM | POA: Diagnosis present

## 2021-01-05 DIAGNOSIS — R0600 Dyspnea, unspecified: Secondary | ICD-10-CM

## 2021-01-05 DIAGNOSIS — E46 Unspecified protein-calorie malnutrition: Secondary | ICD-10-CM

## 2021-01-05 DIAGNOSIS — Z28311 Partially vaccinated for covid-19: Secondary | ICD-10-CM

## 2021-01-05 DIAGNOSIS — J189 Pneumonia, unspecified organism: Secondary | ICD-10-CM

## 2021-01-05 DIAGNOSIS — E8881 Metabolic syndrome: Secondary | ICD-10-CM | POA: Diagnosis present

## 2021-01-05 DIAGNOSIS — Z833 Family history of diabetes mellitus: Secondary | ICD-10-CM | POA: Diagnosis not present

## 2021-01-05 DIAGNOSIS — A4189 Other specified sepsis: Secondary | ICD-10-CM | POA: Diagnosis not present

## 2021-01-05 DIAGNOSIS — R0603 Acute respiratory distress: Secondary | ICD-10-CM

## 2021-01-05 DIAGNOSIS — Z6841 Body Mass Index (BMI) 40.0 and over, adult: Secondary | ICD-10-CM | POA: Diagnosis not present

## 2021-01-05 DIAGNOSIS — I2699 Other pulmonary embolism without acute cor pulmonale: Secondary | ICD-10-CM

## 2021-01-05 DIAGNOSIS — R0902 Hypoxemia: Secondary | ICD-10-CM

## 2021-01-05 DIAGNOSIS — R0682 Tachypnea, not elsewhere classified: Secondary | ICD-10-CM

## 2021-01-05 DIAGNOSIS — A419 Sepsis, unspecified organism: Secondary | ICD-10-CM

## 2021-01-05 LAB — CBC WITH DIFFERENTIAL/PLATELET
Abs Immature Granulocytes: 0.05 K/uL (ref 0.00–0.07)
Basophils Absolute: 0 K/uL (ref 0.0–0.1)
Basophils Relative: 0 %
Eosinophils Absolute: 0 K/uL (ref 0.0–0.5)
Eosinophils Relative: 0 %
HCT: 38 % (ref 36.0–46.0)
Hemoglobin: 11.1 g/dL — ABNORMAL LOW (ref 12.0–15.0)
Immature Granulocytes: 0 %
Lymphocytes Relative: 7 %
Lymphs Abs: 0.8 K/uL (ref 0.7–4.0)
MCH: 24 pg — ABNORMAL LOW (ref 26.0–34.0)
MCHC: 29.2 g/dL — ABNORMAL LOW (ref 30.0–36.0)
MCV: 82.3 fL (ref 80.0–100.0)
Monocytes Absolute: 0.6 K/uL (ref 0.1–1.0)
Monocytes Relative: 5 %
Neutro Abs: 11.2 K/uL — ABNORMAL HIGH (ref 1.7–7.7)
Neutrophils Relative %: 88 %
Platelets: 275 K/uL (ref 150–400)
RBC: 4.62 MIL/uL (ref 3.87–5.11)
RDW: 15.9 % — ABNORMAL HIGH (ref 11.5–15.5)
WBC: 12.8 K/uL — ABNORMAL HIGH (ref 4.0–10.5)
nRBC: 0 % (ref 0.0–0.2)

## 2021-01-05 LAB — RESP PANEL BY RT-PCR (FLU A&B, COVID) ARPGX2
Influenza A by PCR: NEGATIVE
Influenza B by PCR: NEGATIVE
SARS Coronavirus 2 by RT PCR: POSITIVE — AB

## 2021-01-05 LAB — COMPREHENSIVE METABOLIC PANEL WITH GFR
ALT: 10 U/L (ref 0–44)
AST: 21 U/L (ref 15–41)
Albumin: 3.4 g/dL — ABNORMAL LOW (ref 3.5–5.0)
Alkaline Phosphatase: 90 U/L (ref 38–126)
Anion gap: 8 (ref 5–15)
BUN: 7 mg/dL (ref 6–20)
CO2: 25 mmol/L (ref 22–32)
Calcium: 8.2 mg/dL — ABNORMAL LOW (ref 8.9–10.3)
Chloride: 100 mmol/L (ref 98–111)
Creatinine, Ser: 0.76 mg/dL (ref 0.44–1.00)
GFR, Estimated: >60 mL/min
Glucose, Bld: 161 mg/dL — ABNORMAL HIGH (ref 70–99)
Potassium: 3.9 mmol/L (ref 3.5–5.1)
Sodium: 133 mmol/L — ABNORMAL LOW (ref 135–145)
Total Bilirubin: 0.4 mg/dL (ref 0.3–1.2)
Total Protein: 7.7 g/dL (ref 6.5–8.1)

## 2021-01-05 LAB — FIBRINOGEN: Fibrinogen: 596 mg/dL — ABNORMAL HIGH (ref 210–475)

## 2021-01-05 LAB — LACTIC ACID, PLASMA: Lactic Acid, Venous: 0.9 mmol/L (ref 0.5–1.9)

## 2021-01-05 LAB — D-DIMER, QUANTITATIVE: D-Dimer, Quant: 0.34 ug/mL-FEU (ref 0.00–0.50)

## 2021-01-05 LAB — LACTATE DEHYDROGENASE: LDH: 203 U/L — ABNORMAL HIGH (ref 98–192)

## 2021-01-05 LAB — C-REACTIVE PROTEIN: CRP: 12.3 mg/dL — ABNORMAL HIGH (ref <1.0)

## 2021-01-05 LAB — TRIGLYCERIDES: Triglycerides: 84 mg/dL (ref <150)

## 2021-01-05 LAB — FERRITIN: Ferritin: 43 ng/mL (ref 11–307)

## 2021-01-05 LAB — TROPONIN I (HIGH SENSITIVITY): Troponin I (High Sensitivity): 3 ng/L

## 2021-01-05 LAB — PROCALCITONIN: Procalcitonin: 0.59 ng/mL

## 2021-01-05 MED ORDER — SODIUM CHLORIDE 0.9 % IV SOLN
500.0000 mg | INTRAVENOUS | Status: DC
  Administered 2021-01-06: 500 mg via INTRAVENOUS
  Filled 2021-01-05 (×3): qty 500

## 2021-01-05 MED ORDER — SODIUM CHLORIDE 0.9 % IV SOLN
100.0000 mg | Freq: Every day | INTRAVENOUS | Status: DC
  Filled 2021-01-05 (×2): qty 20

## 2021-01-05 MED ORDER — DEXAMETHASONE SODIUM PHOSPHATE 10 MG/ML IJ SOLN
10.0000 mg | Freq: Once | INTRAMUSCULAR | Status: AC
  Administered 2021-01-05: 10 mg via INTRAVENOUS
  Filled 2021-01-05: qty 1

## 2021-01-05 MED ORDER — SODIUM CHLORIDE 0.9 % IV SOLN
2.0000 g | INTRAVENOUS | Status: DC
  Administered 2021-01-05: 2 g via INTRAVENOUS
  Filled 2021-01-05: qty 20

## 2021-01-05 MED ORDER — LACTATED RINGERS IV BOLUS (SEPSIS)
1000.0000 mL | Freq: Once | INTRAVENOUS | Status: AC
  Administered 2021-01-05: 1000 mL via INTRAVENOUS

## 2021-01-05 MED ORDER — SODIUM CHLORIDE 0.9 % IV SOLN
100.0000 mg | INTRAVENOUS | Status: AC
  Administered 2021-01-05 – 2021-01-06 (×2): 100 mg via INTRAVENOUS
  Filled 2021-01-05: qty 20

## 2021-01-05 MED ORDER — LACTATED RINGERS IV BOLUS (SEPSIS): 800.0000 mL | Freq: Once | INTRAVENOUS | Status: DC

## 2021-01-05 MED ORDER — METHYLPREDNISOLONE SODIUM SUCC 125 MG IJ SOLR
125.0000 mg | Freq: Once | INTRAMUSCULAR | Status: AC
  Administered 2021-01-05: 125 mg via INTRAMUSCULAR

## 2021-01-05 MED ORDER — ACETAMINOPHEN 500 MG PO TABS
1000.0000 mg | ORAL_TABLET | Freq: Once | ORAL | Status: AC
  Administered 2021-01-06: 1000 mg via ORAL
  Filled 2021-01-05: qty 2

## 2021-01-05 MED ORDER — LACTATED RINGERS IV SOLN: INTRAVENOUS | Status: DC

## 2021-01-05 NOTE — ED Provider Notes (Signed)
[facility]    CSN: [id number] Arrival date & time: 01/05/21  1923      History   Chief Complaint No chief complaint on file.   HPI Caroline Yoder is a 30 y.o. female.   Reports SOB, fatigue, body aches, headache for the last 3 days. Reports that she is having L rib pain and L back pain with deep breathing and coughing. Has been taking Nyquil with honey with no relief. Denies sick contacts. Has negative history of Covid. Has completed Covid vaccines. Has not completed flu vaccine. Denies fever, abdominal pain, nausea, vomiting, diarrhea, rash, other symptoms.  ROS per HPI  The history is provided by the patient.    Past Medical History:  Diagnosis Date  . Abnormal uterine bleeding (AUB) 05/23/2015  . Contraceptive management 05/23/2015  . Hx of chlamydia infection   . Menorrhagia 02/11/2014  . Obesity   . Trichimoniasis 05/23/2015  . Vaginal discharge 05/23/2015    Patient Active Problem List   Diagnosis Date Noted  . CAP (community acquired pneumonia) 01/06/2021  . Sepsis (HCC) 01/06/2021  . Acute respiratory failure with hypoxia (HCC) 01/06/2021  . Hyperglycemia 01/06/2021  . Hypoalbuminemia due to protein-calorie malnutrition (HCC) 01/06/2021  . Pneumonia due to COVID-19 virus 01/05/2021  . Morbid obesity (HCC) 01/11/2018  . Screening examination for STD (sexually transmitted disease) 01/11/2018  . Pregnancy examination or test, negative result 01/11/2018  . BV (bacterial vaginosis) 08/19/2015  . Abnormal uterine bleeding (AUB) 05/23/2015  . Vaginal discharge 05/23/2015  . Trichimoniasis 05/23/2015  . Contraceptive management 05/23/2015  . Menorrhagia 02/11/2014  . Hx of chlamydia infection 12/28/2012    History reviewed. No pertinent surgical history.  OB History    Gravida  0   Para      Term      Preterm      AB      Living        SAB      IAB      Ectopic      Multiple      Live Births               Home Medications     Prior to Admission medications   Medication Sig Start Date End Date Taking? Authorizing Provider  acetaminophen (TYLENOL) 325 MG tablet Take 2 tablets (650 mg total) by mouth every 6 (six) hours as needed for mild pain, fever or headache (fever >/= 101). 01/06/21   Shon Hale, MD  albuterol (VENTOLIN HFA) 108 (90 Base) MCG/ACT inhaler Inhale 2 puffs into the lungs every 6 (six) hours. 01/06/21   Shon Hale, MD  ascorbic acid (VITAMIN C) 500 MG tablet Take 1 tablet (500 mg total) by mouth daily. 01/07/21   Shon Hale, MD  azithromycin (ZITHROMAX) 500 MG tablet Take 1 tablet (500 mg total) by mouth daily for 3 days. 01/06/21 01/09/21  Shon Hale, MD  cefdinir (OMNICEF) 300 MG capsule Take 1 capsule (300 mg total) by mouth 2 (two) times daily for 5 days. 01/06/21 01/11/21  Shon Hale, MD  guaiFENesin (MUCINEX) 600 MG 12 hr tablet Take 1 tablet (600 mg total) by mouth 2 (two) times daily for 10 days. 01/06/21 01/16/21  Shon Hale, MD  metFORMIN (GLUCOPHAGE) 500 MG tablet Take 1 tablet (500 mg total) by mouth 2 (two) times daily with a meal. For  High Blood sugar and Metabolic Syndrome X 01/06/21 01/06/22  Shon Hale, MD  Multiple Vitamin (MULTIVITAMIN WITH MINERALS) TABS  tablet Take 1 tablet by mouth daily. 01/07/21   Shon Hale, MD  pantoprazole (PROTONIX) 40 MG tablet Take 1 tablet (40 mg total) by mouth daily. 01/06/21   Shon Hale, MD  predniSONE (DELTASONE) 20 MG tablet Take 2 tablets (40 mg total) by mouth daily with breakfast for 5 days. 01/06/21 01/11/21  Shon Hale, MD  zinc sulfate 220 (50 Zn) MG capsule Take 1 capsule (220 mg total) by mouth daily. 01/07/21   Shon Hale, MD    Family History Family History  Problem Relation Age of Onset  . Diabetes Mother   . Thyroid disease Mother   . Thyroid disease Brother   . Diabetes Brother   . Thyroid disease Brother   . Seizures Brother   . Diabetes Brother   . Hypertension Maternal  Grandmother   . Diabetes Other     Social History Social History   Tobacco Use  . Smoking status: Never Smoker  . Smokeless tobacco: Never Used  Vaping Use  . Vaping Use: Never used  Substance Use Topics  . Alcohol use: No  . Drug use: No     Allergies   Strawberry flavor   Review of Systems Review of Systems   Physical Exam Triage Vital Signs ED Triage Vitals  Enc Vitals Group     BP 01/05/21 1931 (!) 170/91     Pulse Rate 01/05/21 1931 (!) 122     Resp 01/05/21 1931 (!) 21     Temp 01/05/21 1931 99.4 F (37.4 C)     Temp Source 01/05/21 1931 Temporal     SpO2 01/05/21 1931 93 %     Weight --      Height --      Head Circumference --      Peak Flow --      Pain Score 01/05/21 1933 0     Pain Loc --      Pain Edu? --      Excl. in GC? --    No data found.  Updated Vital Signs BP (!) 170/91 (BP Location: Right Arm)   Pulse (!) 122   Temp 99.4 F (37.4 C) (Temporal)   Resp (!) 21   LMP 12/29/2020   SpO2 97%       Physical Exam Constitutional:      General: She is in acute distress.     Appearance: She is obese. She is ill-appearing. She is not toxic-appearing or diaphoretic.  HENT:     Head: Normocephalic and atraumatic.     Right Ear: Tympanic membrane, ear canal and external ear normal.     Left Ear: Tympanic membrane, ear canal and external ear normal.     Nose: Nose normal.     Mouth/Throat:     Mouth: Mucous membranes are moist.     Pharynx: Oropharynx is clear.  Eyes:     Extraocular Movements: Extraocular movements intact.     Pupils: Pupils are equal, round, and reactive to light.  Cardiovascular:     Rate and Rhythm: Tachycardia present.     Pulses: Normal pulses.     Heart sounds: Normal heart sounds.  Pulmonary:     Effort: Respiratory distress present.     Breath sounds: No stridor. Examination of the right-upper field reveals wheezing. Examination of the left-upper field reveals wheezing. Examination of the right-lower field  reveals decreased breath sounds. Examination of the left-lower field reveals decreased breath sounds. Decreased breath sounds, wheezing and rhonchi (L lung)  present. No rales.     Comments: Tachypnea  Chest:     Chest wall: No tenderness.  Musculoskeletal:        General: Normal range of motion.     Cervical back: Normal range of motion.  Lymphadenopathy:     Cervical: Cervical adenopathy present.  Skin:    General: Skin is warm and dry.     Capillary Refill: Capillary refill takes less than 2 seconds.  Neurological:     General: No focal deficit present.     Mental Status: She is oriented to person, place, and time.  Psychiatric:        Mood and Affect: Mood normal.        Behavior: Behavior normal.        Thought Content: Thought content normal.      UC Treatments / Results  Labs (all labs ordered are listed, but only abnormal results are displayed) Labs Reviewed  COVID-19, FLU A+B NAA - Abnormal; Notable for the following components:      Result Value   SARS-CoV-2, NAA Detected (*)    All other components within normal limits   Narrative:    Test(s) 140142-Influenza A, NAA; 140143-Influenza B, NAA was developed and its performance characteristics determined by [facility]. It has not been cleared or approved by the Food and Drug Administration. Performed at:  [address] [place], [place]  [id number] Lab Director: Jolene Schimke MD, Phone:  [phone]    EKG   Radiology DG Chest 2 View  Result Date: 01/05/2021 CLINICAL DATA:  Shortness of breath and cough EXAM: CHEST - 2 VIEW COMPARISON:  None available FINDINGS: Consolidation left lung base. Right lung grossly clear. Normal cardiomediastinal silhouette. No pneumothorax IMPRESSION: Left lung base pneumonia Electronically Signed   By: Jasmine Pang M.D.   On: 01/05/2021 19:59    Procedures Procedures (including critical care time)  Medications Ordered in UC Medications  methylPREDNISolone  sodium succinate (SOLU-MEDROL) 125 mg/2 mL injection 125 mg (125 mg Intramuscular Given 01/05/21 2014)    Initial Impression / Assessment and Plan / UC Course  I have reviewed the triage vital signs and the nursing notes.  Pertinent labs & imaging results that were available during my care of the patient were reviewed by me and considered in my medical decision making (see chart for details).    Pneumonia of L lower lobe Cough SOB Fatigue Hypoxia Tachycardia Tachypnea Respiratory distress  Chest xray today shows left lower lobe pneumonia Solumedrol 125mg  IM in office today When walking to xray, pt SOB worsened and instructions had to be repeated O2 after walking back from xray 86% Placed on 4lpm O2 via , O2 up to 97% Discussed with patient that given her xray, her oxygen levels and need for supplemental oxygen that she needs to be further evaluated and treated in the ER She agrees to this, but states that she has got to go home first to ensure care for her son with autism Discussed that when O2 drops, we cannot think clearly and make decisions She declines still Discussed that I would like her to go by EMS to the ER She declines, she is A&O x 4 with O2 sats at 97% after about 15 mins on supplemental O2 Emphasized the danger of leaving the office with her respiratory status Patient still declines EMS and states that she will go to the ER in about an hour   Final Clinical Impressions(s) / UC Diagnoses  Final diagnoses:  Cough  Pneumonia of left lower lobe due to infectious organism  SOB (shortness of breath)  Tachycardia  Tachypnea  Hypoxia  Respiratory distress     Discharge Instructions     You have been given a steroid injection in the office today  You need to go to the ER for further evaluation and treatment  I am concerned about your oxygen level and your need for extra oxygen    ED Prescriptions    None     PDMP not reviewed this encounter.    Moshe CiproMatthews, Jakhia Buxton, NP 01/07/21 [phone]

## 2021-01-05 NOTE — ED Triage Notes (Addendum)
Pt with cough and mild sob since Saturday.  Cough productive and yellow in color. Denies any fevers. Pt sent from Urgent Care.

## 2021-01-05 NOTE — ED Notes (Signed)
Date and time results received: 01/05/21 2227   Test: COVID Critical Value: POSITIVE +  Name of Provider Notified: R. Lockwood,MD and C. Aberman,PA-C  Orders Received? Or Actions Taken?: NO new orders

## 2021-01-05 NOTE — ED Triage Notes (Signed)
Coughing up pghlem since Saturday and shortness of breath

## 2021-01-05 NOTE — H&P (Signed)
History and Physical  Caroline Yoder TZG:[id number] DOB: [birth year] DOA: 01/05/2021  Referring physician: Suzy Bouchard, PA-C PCP: Lemmie Evens, MD  Patient coming from: Home  Chief Complaint: Cough  HPI: Caroline Yoder is a 30 y.o. female with no significant medical history who presents to the emergency department from an urgent care due to left lower lobe pneumonia.  Patient complained of 3-day onset of generalized body aches, fatigue, headache and left rib and left back Yoder with deep breathing and coughing.  NyQuil and honey taken at home with no relief.  Cough was occasionally productive with yellow phlegm.  She states that she got 2 COVID vaccines, but she has not received the booster.  This morning, she noted that she was more short of breath when she walks, but breathing was fine when she sits, so she went to an urgent care, where she was noted to be tachycardic and was in respiratory distress due to some wheezing and decreased breath sounds.  Chest x-ray was done and patient was found to have left lower lobe pneumonia, Solu-Medrol 125 mg IM was given, oxygen dropped to 86% on ambulation and she was provided with supplemental oxygen via [place] at 4 LPM with improvement in O2 sats to 97%.  She was then referred to the ED for further evaluation.  ED Course:  In the emergency department, she was febrile with a temperature of 101.6F, tachycardic and intermittently tachypneic, BP was elevated at 170/91, O2 sat was 93-100 on 4 LPM of oxygen.  Work-up in the ED showed leukocytosis, normocytic anemia, hyperglycemia, hypoalbuminemia, procalcitonin was 0.59, troponin 3 > 4, CRP 12.3, D-dimer 0.34, fibrinogen 596, LDH 203.  SARS coronavirus 2 was positive. Chest x-ray showed left lung base pneumonia She was treated with Decadron and started IV remdesivir.  She was started on IV ceftriaxone and azithromycin for community-acquired pneumonia.  Tylenol was given due to fever.  Hospitalist was asked to  admit patient for further evaluation and management.  Review of Systems: Constitutional: Negative for chills and fever.  HENT: Negative for ear Yoder and sore throat.   Eyes: Negative for Yoder and visual disturbance.  Respiratory: Positive for cough and shortness of breath.   Cardiovascular: Negative for chest Yoder and palpitations.  Gastrointestinal: Negative for abdominal Yoder and vomiting.  Endocrine: Negative for polyphagia and polyuria.  Genitourinary: Negative for decreased urine volume, dysuria, enuresis Musculoskeletal: Negative for arthralgias and back Yoder.  Skin: Negative for color change and rash.  Allergic/Immunologic: Negative for immunocompromised state.  Neurological: Negative for tremors, syncope, speech difficulty, weakness, light-headedness and headaches.  Hematological: Does not bruise/bleed easily.  All other systems reviewed and are negative   Past Medical History:  Diagnosis Date  . Abnormal uterine bleeding (AUB) 05/23/2015  . Contraceptive management 05/23/2015  . Hx of chlamydia infection   . Menorrhagia 02/11/2014  . Obesity   . Trichimoniasis 05/23/2015  . Vaginal discharge 05/23/2015   History reviewed. No pertinent surgical history.  Social History:  reports that she has never smoked. She has never used smokeless tobacco. She reports that she does not drink alcohol and does not use drugs.   Allergies  Allergen Reactions  . Strawberry Flavor Anaphylaxis    Family History  Problem Relation Age of Onset  . Diabetes Mother   . Thyroid disease Mother   . Thyroid disease Brother   . Diabetes Brother   . Thyroid disease Brother   . Seizures Brother   . Diabetes Brother   .  Hypertension Maternal Grandmother   . Diabetes Other     Prior to Admission medications   Medication Sig Start Date End Date Taking? Authorizing Provider  ibuprofen (ADVIL) 800 MG tablet Take 1 tablet (800 mg total) by mouth 3 (three) times daily. Take with food 12/10/20    Triplett, Tammy, PA-C  megestrol (MEGACE) 40 MG tablet Take 3 x 5 days then 2 x 5 days then 1 daily Patient not taking: No sig reported 09/03/19   Estill Dooms, NP    Physical Exam: BP (!) 150/79 (BP Location: Right Arm)   Pulse (!) 112   Temp (!) 101.4 F (38.6 C)   Resp 18   Ht 5' 4" (1.626 m)   Wt (!) 177.1 kg   LMP 12/29/2020   SpO2 96%   BMI 67.02 kg/m   . General: 30 y.o. year-old female morbidly obese but in no acute distress.  Alert and oriented x3. Marland Kitchen HEENT: NCAT, EOMI . Neck: Supple, trachea medial . Cardiovascular: Tachycardia, regular rate and rhythm with no rubs or gallops.  No thyromegaly or JVD noted.  2/4 pulses in all 4 extremities. Marland Kitchen Respiratory: Diffuse decreased breath sounds.  No wheezes . Abdomen: Soft nontender nondistended with normal bowel sounds x4 quadrants. . Muskuloskeletal: No cyanosis, clubbing or edema noted bilaterally . Neuro: CN II-XII intact, strength 5/5 x 4, sensation, reflexes intact . Skin: No ulcerative lesions noted or rashes . Psychiatry: Mood is appropriate for condition and setting          Labs on Admission:  Basic Metabolic Panel: Recent Labs  Lab 01/05/21 2119  NA 133*  K 3.9  CL 100  CO2 25  GLUCOSE 161*  BUN 7  CREATININE 0.76  CALCIUM 8.2*   Liver Function Tests: Recent Labs  Lab 01/05/21 2119  AST 21  ALT 10  ALKPHOS 90  BILITOT 0.4  PROT 7.7  ALBUMIN 3.4*   No results for input(s): LIPASE, AMYLASE in the last 168 hours. No results for input(s): AMMONIA in the last 168 hours. CBC: Recent Labs  Lab 01/05/21 2119  WBC 12.8*  NEUTROABS 11.2*  HGB 11.1*  HCT 38.0  MCV 82.3  PLT 275   Cardiac Enzymes: No results for input(s): CKTOTAL, CKMB, CKMBINDEX, TROPONINI in the last 168 hours.  BNP (last 3 results) No results for input(s): BNP in the last 8760 hours.  ProBNP (last 3 results) No results for input(s): PROBNP in the last 8760 hours.  CBG: No results for input(s): GLUCAP in the last  168 hours.  Radiological Exams on Admission: DG Chest 2 View  Result Date: 01/05/2021 CLINICAL DATA:  Shortness of breath and cough EXAM: CHEST - 2 VIEW COMPARISON:  None available FINDINGS: Consolidation left lung base. Right lung grossly clear. Normal cardiomediastinal silhouette. No pneumothorax IMPRESSION: Left lung base pneumonia Electronically Signed   By: Donavan Foil M.D.   On: 01/05/2021 19:59    EKG: I independently viewed the EKG done and my findings are as followed: Sinus tachycardia at rate of 109 bpm  Assessment/Plan Present on Admission: . Pneumonia due to COVID-19 virus  Principal Problem:   Pneumonia due to COVID-19 virus Active Problems:   CAP (community acquired pneumonia)   Sepsis ([place])   Acute respiratory failure with hypoxia ([place])   Hyperglycemia   Hypoalbuminemia due to protein-calorie malnutrition ([facility])  Acute respiratory failure with hypoxia secondary to sepsis due to CAP superimposed with incidental finding of COVID-19 virus infection Chest x-ray showed left lung  base pneumonia Patient was tachycardic and febrile, WBC was > 12 (met SIRS criteria ).  Sepsis criteria was met due to left lung base pneumonia and endorgan dysfunction due to hypoxia on ambulation (patient does not use oxygen at baseline). Procalcitonin was 0.59.  She was started on IV ceftriaxone and azithromycin, we shall continue with same at this time Continue Tylenol as needed for fever Blood culture, urine Legionella, strep pneumo and sputum culture pending Continue albuterol q.6h Continue IV Solu-Medrol Continue IV Remdesivir per pharmacy protocol Continue vitamin-C 500 mg p.o. Daily Continue zinc 220 mg p.o. Daily Continue Mucinex Robitussin and Tussionex Continue supplemental oxygen to maintain O2 sat > or = 94% with plan to wean patient off supplemental oxygen as tolerated (of note, patient does not use oxygen at baseline) Continue incentive spirometry and flutter valve q15mn as  tolerated Encourage proning, early ambulation, and side laying as tolerated Continue airborne isolation precaution Inflammatory markers: LDH:203 CRP: 12.3 D-dimer: 0.34 Ferritin: 43 Continue monitoring daily inflammatory markers Physician PPE:  Surgical mask with face shield, N-95, nonsterile gloves, disposable gown, head and shoe covers Patient PPE:  Face mask   Leukocytosis possible secondary to above Continue management as described above  Hyperglycemia possibly reactive CBG 161, continue to monitor blood glucose level  Hypoalbuminemia possibly secondary to mild protein calorie malnutrition Protein supplement will be provided  DVT prophylaxis: Lovenox  Code Status: Full code  Family Communication: None at bedside  Disposition Plan:  Patient is from:                        home Anticipated DC to:                   SNF or family members home Anticipated DC date:               2-3 days Anticipated DC barriers:         patient requires inpatient management due to sepsis secondary to CAP superimposed with COVID-19 virus infection   Consults called: None  Admission status: Inpatient    OBernadette HoitMD [facility]  01/06/2021, 2:49 AM

## 2021-01-05 NOTE — ED Provider Notes (Signed)
[facility]PART[facility] Provider Note   CSN: [id number] Arrival date & time: 01/05/21  2044     History Chief Complaint  Patient presents with  . Cough    Caroline Yoder is a 30 y.o. female with no significant past medical history who presents to the ED from urgent care due to left lower lobe pneumonia associated with respiratory distress with O2 saturation at 86% with ambulation.  Patient admits to productive cough, shortness of breath, fatigue, and myalgias x3 days.  Shortness of breath worse with exertion.  Patient endorses a productive cough with yellow phlegm.  Denies sick contacts or known COVID exposures.  She is currently vaccinated against COVID-19 however, has not received her booster shot.  Patient also admits to left-sided chest pain.  Denies history of blood clots, recent surgeries, recent long immobilizations, and hormonal treatments.  No lower extremity edema.  No history of asthma.  Patient denies tobacco use.  No treatment prior to arrival.  No aggravating or alleviating symptoms.  History obtained from patient and past medical records. No interpreter used during encounter.      Past Medical History:  Diagnosis Date  . Abnormal uterine bleeding (AUB) 05/23/2015  . Contraceptive management 05/23/2015  . Hx of chlamydia infection   . Menorrhagia 02/11/2014  . Obesity   . Trichimoniasis 05/23/2015  . Vaginal discharge 05/23/2015    Patient Active Problem List   Diagnosis Date Noted  . Morbid obesity (HCC) 01/11/2018  . Screening examination for STD (sexually transmitted disease) 01/11/2018  . Pregnancy examination or test, negative result 01/11/2018  . BV (bacterial vaginosis) 08/19/2015  . Abnormal uterine bleeding (AUB) 05/23/2015  . Vaginal discharge 05/23/2015  . Trichimoniasis 05/23/2015  . Contraceptive management 05/23/2015  . Menorrhagia 02/11/2014  . Hx of chlamydia infection 12/28/2012    History reviewed. No pertinent surgical history.   OB  History    Gravida  0   Para      Term      Preterm      AB      Living        SAB      IAB      Ectopic      Multiple      Live Births              Family History  Problem Relation Age of Onset  . Diabetes Mother   . Thyroid disease Mother   . Thyroid disease Brother   . Diabetes Brother   . Thyroid disease Brother   . Seizures Brother   . Diabetes Brother   . Hypertension Maternal Grandmother   . Diabetes Other     Social History   Tobacco Use  . Smoking status: Never Smoker  . Smokeless tobacco: Never Used  Vaping Use  . Vaping Use: Never used  Substance Use Topics  . Alcohol use: No  . Drug use: No    Home Medications Prior to Admission medications   Medication Sig Start Date End Date Taking? Authorizing Provider  ibuprofen (ADVIL) 800 MG tablet Take 1 tablet (800 mg total) by mouth 3 (three) times daily. Take with food 12/10/20   Triplett, Tammy, PA-C  megestrol (MEGACE) 40 MG tablet Take 3 x 5 days then 2 x 5 days then 1 daily Patient not taking: No sig reported 09/03/19   Adline PotterGriffin, Jennifer A, NP    Allergies    Strawberry flavor  Review of Systems   Review of  Systems  Constitutional: Negative for chills and fever.  Respiratory: Positive for cough and shortness of breath.   Cardiovascular: Positive for chest pain. Negative for leg swelling.  Gastrointestinal: Negative for abdominal pain, diarrhea, nausea and vomiting.  Genitourinary: Negative for dysuria.  All other systems reviewed and are negative.   Physical Exam Updated Vital Signs BP (!) 122/105 (BP Location: Right Arm)   Pulse (!) 114   Temp (!) 101.7 F (38.7 C) (Oral)   Resp 16   Ht 5\' 4"  (1.626 m)   Wt (!) 176.4 kg   LMP 12/29/2020   SpO2 96%   BMI 66.77 kg/m   Physical Exam Vitals and nursing note reviewed.  Constitutional:      General: She is not in acute distress.    Appearance: She is not ill-appearing.  HENT:     Head: Normocephalic.  Eyes:     Pupils:  Pupils are equal, round, and reactive to light.  Cardiovascular:     Rate and Rhythm: Regular rhythm. Tachycardia present.     Pulses: Normal pulses.     Heart sounds: Normal heart sounds. No murmur heard. No friction rub. No gallop.   Pulmonary:     Effort: Pulmonary effort is normal.     Breath sounds: Normal breath sounds.     Comments: Decreased air movement throughout.  Abdominal:     General: Abdomen is flat. There is no distension.     Palpations: Abdomen is soft.     Tenderness: There is no abdominal tenderness. There is no guarding or rebound.  Musculoskeletal:        General: Normal range of motion.     Cervical back: Neck supple.     Comments: No lower extremity edema. Negative homan sign bilaterally.   Skin:    General: Skin is warm and dry.  Neurological:     General: No focal deficit present.     Mental Status: She is alert.  Psychiatric:        Mood and Affect: Mood normal.        Behavior: Behavior normal.     ED Results / Procedures / Treatments   Labs (all labs ordered are listed, but only abnormal results are displayed) Labs Reviewed  RESP PANEL BY RT-PCR (FLU A&B, COVID) ARPGX2 - Abnormal; Notable for the following components:      Result Value   SARS Coronavirus 2 by RT PCR POSITIVE (*)    All other components within normal limits  COMPREHENSIVE METABOLIC PANEL - Abnormal; Notable for the following components:   Sodium 133 (*)    Glucose, Bld 161 (*)    Calcium 8.2 (*)    Albumin 3.4 (*)    All other components within normal limits  CBC WITH DIFFERENTIAL/PLATELET - Abnormal; Notable for the following components:   WBC 12.8 (*)    Hemoglobin 11.1 (*)    MCH 24.0 (*)    MCHC 29.2 (*)    RDW 15.9 (*)    Neutro Abs 11.2 (*)    All other components within normal limits  CULTURE, BLOOD (ROUTINE X 2)  CULTURE, BLOOD (ROUTINE X 2)  LACTIC ACID, PLASMA  D-DIMER, QUANTITATIVE  PROCALCITONIN  LACTATE DEHYDROGENASE  FERRITIN  TRIGLYCERIDES   FIBRINOGEN  C-REACTIVE PROTEIN  POC URINE PREG, ED  TROPONIN I (HIGH SENSITIVITY)  TROPONIN I (HIGH SENSITIVITY)    EKG EKG Interpretation  Date/Time:  Monday Jan 05 2021 21:21:42 EDT Ventricular Rate:  109 PR Interval:  162  QRS Duration: 69 QT Interval:  328 QTC Calculation: 442 R Axis:   64 Text Interpretation: Sinus tachycardia Borderline T abnormalities, inferior leads Abnormal ECG Confirmed by Gerhard Munch [phone]) on 01/05/2021 9:27:28 PM   Radiology DG Chest 2 View  Result Date: 01/05/2021 CLINICAL DATA:  Shortness of breath and cough EXAM: CHEST - 2 VIEW COMPARISON:  None available FINDINGS: Consolidation left lung base. Right lung grossly clear. Normal cardiomediastinal silhouette. No pneumothorax IMPRESSION: Left lung base pneumonia Electronically Signed   By: Jasmine Pang M.D.   On: 01/05/2021 19:59    Procedures .Critical Care Performed by: Mannie Stabile, PA-C Authorized by: Mannie Stabile, PA-C   Critical care provider statement:    Critical care time (minutes):  38   Critical care was necessary to treat or prevent imminent or life-threatening deterioration of the following conditions:  Respiratory failure   Critical care was time spent personally by me on the following activities:  Discussions with consultants, evaluation of patient's response to treatment, examination of patient, ordering and performing treatments and interventions, ordering and review of laboratory studies, ordering and review of radiographic studies, pulse oximetry, re-evaluation of patient's condition, obtaining history from patient or surrogate and review of old charts   I assumed direction of critical care for this patient from another provider in my specialty: no     Care discussed with: admitting provider       Medications Ordered in ED Medications  cefTRIAXone (ROCEPHIN) 2 g in sodium chloride 0.9 % 100 mL IVPB (2 g Intravenous New Bag/Given 01/05/21 2159)  azithromycin  (ZITHROMAX) 500 mg in sodium chloride 0.9 % 250 mL IVPB (has no administration in time range)  dexamethasone (DECADRON) injection 10 mg (has no administration in time range)  lactated ringers bolus 1,000 mL (1,000 mLs Intravenous New Bag/Given 01/05/21 2155)    ED Course  I have reviewed the triage vital signs and the nursing notes.  Pertinent labs & imaging results that were available during my care of the patient were reviewed by me and considered in my medical decision making (see chart for details).  Clinical Course as of 01/05/21 2302  Mon Jan 05, 2021  2100 Temp(!): 101.7 F (38.7 C) [CA]  2100 Pulse Rate(!): 114 [CA]  2100 BP(!): 122/105 [CA]  2212 WBC(!): 12.8 [CA]  2212 Lactic Acid, Venous: 0.9 [CA]  2212 Sodium(!): 133 [CA]  2212 Glucose(!): 161 [CA]  2212 Hemoglobin(!): 11.1 [CA]  2231 SARS Coronavirus 2 by RT PCR(!): POSITIVE [CA]    Clinical Course User Index [CA] Mannie Stabile, PA-C   MDM Rules/Calculators/A&P                         30 year old female presents to the ED from urgent care due to left lower lobe pneumonia associated with respiratory distress with O2 saturation at 86% with exertion.  Upon arrival, patient febrile and tachycardic.  Code sepsis initiated after initial evaluation.  Chest x-ray personally reviewed from urgent care which demonstrates left lower lobe pneumonia.  COVID/influenza test still pending from urgent care.  We will repeat respiratory panel to obtain results sooner.  Sepsis labs ordered.  IV fluids and antibiotics started.  CBC significant for mild leukocytosis 12.8.  Mild anemia with hemoglobin 11.1.  COVID-positive.  IVFs stopped due to COVID positive status and likely viral pneumonia. CMP significant for mild hyponatremia 133, hyperglycemia 161 with no anion gap.  Doubt DKA.  Normal renal function.  Lactic  acid normal at 0.9.  Troponin normal at 3.  EKG personally reviewed which demonstrates sinus tachycardia with nonspecific T wave  abnormalities. Low suspicion for ACS. Suspect symptoms related to COVID-19 infection.   10:37 PM Patient ambulated in room and desaturated in 70s. Pre-COVID admission labs ordered.   Discussed case with Dr. Thomes Dinning with [facility] who agrees to admit patient for further treatment. Remdesivir and Decadron given.   Caroline Yoder was evaluated in Emergency Department on 01/05/2021 for the symptoms described in the history of present illness. She was evaluated in the context of the global COVID-19 pandemic, which necessitated consideration that the patient might be at risk for infection with the SARS-CoV-2 virus that causes COVID-19. Institutional protocols and algorithms that pertain to the evaluation of patients at risk for COVID-19 are in a state of rapid change based on information released by regulatory bodies including the CDC and federal and state organizations. These policies and algorithms were followed during the patient's care in the ED.  Final Clinical Impression(s) / ED Diagnoses Final diagnoses:  COVID-19    Rx / DC Orders ED Discharge Orders    None       Jesusita Oka 01/05/21 2302    Gerhard Munch, MD 01/08/21 2141

## 2021-01-05 NOTE — Discharge Instructions (Addendum)
You have been given a steroid injection in the office today  You need to go to the ER for further evaluation and treatment  I am concerned about your oxygen level and your need for extra oxygen

## 2021-01-05 NOTE — Sepsis Progress Note (Signed)
Following for Code Sepsis  

## 2021-01-05 NOTE — ED Notes (Signed)
Ambulated pt in room. After 1 minuet of walking pt dropped to 79% HR 136. Pt back in bed O2 back to 96%

## 2021-01-06 ENCOUNTER — Inpatient Hospital Stay (HOSPITAL_COMMUNITY): Payer: Medicare HMO

## 2021-01-06 DIAGNOSIS — J189 Pneumonia, unspecified organism: Secondary | ICD-10-CM

## 2021-01-06 DIAGNOSIS — E8809 Other disorders of plasma-protein metabolism, not elsewhere classified: Secondary | ICD-10-CM

## 2021-01-06 DIAGNOSIS — J9601 Acute respiratory failure with hypoxia: Secondary | ICD-10-CM

## 2021-01-06 DIAGNOSIS — R739 Hyperglycemia, unspecified: Secondary | ICD-10-CM

## 2021-01-06 DIAGNOSIS — A419 Sepsis, unspecified organism: Secondary | ICD-10-CM

## 2021-01-06 DIAGNOSIS — E46 Unspecified protein-calorie malnutrition: Secondary | ICD-10-CM

## 2021-01-06 LAB — CBC WITH DIFFERENTIAL/PLATELET
Abs Immature Granulocytes: 0.06 10*3/uL (ref 0.00–0.07)
Basophils Absolute: 0 10*3/uL (ref 0.0–0.1)
Basophils Relative: 0 %
Eosinophils Absolute: 0 10*3/uL (ref 0.0–0.5)
Eosinophils Relative: 0 %
HCT: 36.7 % (ref 36.0–46.0)
Hemoglobin: 10.8 g/dL — ABNORMAL LOW (ref 12.0–15.0)
Immature Granulocytes: 1 %
Lymphocytes Relative: 3 %
Lymphs Abs: 0.4 10*3/uL — ABNORMAL LOW (ref 0.7–4.0)
MCH: 23.9 pg — ABNORMAL LOW (ref 26.0–34.0)
MCHC: 29.4 g/dL — ABNORMAL LOW (ref 30.0–36.0)
MCV: 81.2 fL (ref 80.0–100.0)
Monocytes Absolute: 0.2 10*3/uL (ref 0.1–1.0)
Monocytes Relative: 2 %
Neutro Abs: 11.7 10*3/uL — ABNORMAL HIGH (ref 1.7–7.7)
Neutrophils Relative %: 94 %
Platelets: 308 10*3/uL (ref 150–400)
RBC: 4.52 MIL/uL (ref 3.87–5.11)
RDW: 15.9 % — ABNORMAL HIGH (ref 11.5–15.5)
WBC: 12.4 10*3/uL — ABNORMAL HIGH (ref 4.0–10.5)
nRBC: 0 % (ref 0.0–0.2)

## 2021-01-06 LAB — COVID-19, FLU A+B NAA
Influenza A, NAA: NOT DETECTED
Influenza B, NAA: NOT DETECTED
SARS-CoV-2, NAA: DETECTED — AB

## 2021-01-06 LAB — COMPREHENSIVE METABOLIC PANEL
ALT: 9 U/L (ref 0–44)
AST: 19 U/L (ref 15–41)
Albumin: 3.3 g/dL — ABNORMAL LOW (ref 3.5–5.0)
Alkaline Phosphatase: 91 U/L (ref 38–126)
Anion gap: 11 (ref 5–15)
BUN: 7 mg/dL (ref 6–20)
CO2: 25 mmol/L (ref 22–32)
Calcium: 8.5 mg/dL — ABNORMAL LOW (ref 8.9–10.3)
Chloride: 101 mmol/L (ref 98–111)
Creatinine, Ser: 0.8 mg/dL (ref 0.44–1.00)
GFR, Estimated: >60 mL/min (ref >60)
Glucose, Bld: 264 mg/dL — ABNORMAL HIGH (ref 70–99)
Potassium: 3.7 mmol/L (ref 3.5–5.1)
Sodium: 137 mmol/L (ref 135–145)
Total Bilirubin: 0.3 mg/dL (ref 0.3–1.2)
Total Protein: 7.6 g/dL (ref 6.5–8.1)

## 2021-01-06 LAB — C-REACTIVE PROTEIN: CRP: 17 mg/dL — ABNORMAL HIGH (ref <1.0)

## 2021-01-06 LAB — HEMOGLOBIN A1C
Hgb A1c MFr Bld: 6.8 % — ABNORMAL HIGH (ref 4.8–5.6)
Mean Plasma Glucose: 148.46 mg/dL

## 2021-01-06 LAB — HIV ANTIBODY (ROUTINE TESTING W REFLEX): HIV Screen 4th Generation wRfx: NONREACTIVE

## 2021-01-06 LAB — PHOSPHORUS: Phosphorus: 2.7 mg/dL (ref 2.5–4.6)

## 2021-01-06 LAB — MRSA PCR SCREENING: MRSA by PCR: NEGATIVE

## 2021-01-06 LAB — FERRITIN: Ferritin: 54 ng/mL (ref 11–307)

## 2021-01-06 LAB — MAGNESIUM: Magnesium: 1.9 mg/dL (ref 1.7–2.4)

## 2021-01-06 LAB — TROPONIN I (HIGH SENSITIVITY): Troponin I (High Sensitivity): 4 ng/L (ref <18)

## 2021-01-06 LAB — D-DIMER, QUANTITATIVE: D-Dimer, Quant: 2.08 ug/mL-FEU — ABNORMAL HIGH (ref 0.00–0.50)

## 2021-01-06 MED ORDER — METHYLPREDNISOLONE SODIUM SUCC 125 MG IJ SOLR
0.5000 mg/kg | Freq: Two times a day (BID) | INTRAMUSCULAR | Status: DC
  Administered 2021-01-06: 88.125 mg via INTRAVENOUS
  Filled 2021-01-06: qty 2

## 2021-01-06 MED ORDER — INSULIN ASPART 100 UNIT/ML IJ SOLN: 0.0000 [IU] | Freq: Three times a day (TID) | INTRAMUSCULAR | Status: DC

## 2021-01-06 MED ORDER — DM-GUAIFENESIN ER 30-600 MG PO TB12
1.0000 | ORAL_TABLET | Freq: Two times a day (BID) | ORAL | Status: DC
  Administered 2021-01-06: 1 via ORAL
  Filled 2021-01-06: qty 1

## 2021-01-06 MED ORDER — ALBUTEROL SULFATE HFA 108 (90 BASE) MCG/ACT IN AERS
2.0000 | INHALATION_SPRAY | Freq: Four times a day (QID) | RESPIRATORY_TRACT | Status: DC
  Administered 2021-01-06 (×2): 2 via RESPIRATORY_TRACT
  Filled 2021-01-06: qty 6.7

## 2021-01-06 MED ORDER — METFORMIN HCL 500 MG PO TABS: 500.0000 mg | ORAL_TABLET | Freq: Two times a day (BID) | ORAL | 1 refills | Status: DC

## 2021-01-06 MED ORDER — INSULIN GLARGINE 100 UNIT/ML ~~LOC~~ SOLN
10.0000 [IU] | Freq: Every day | SUBCUTANEOUS | Status: DC
  Filled 2021-01-06 (×2): qty 0.1

## 2021-01-06 MED ORDER — GUAIFENESIN ER 600 MG PO TB12: 600.0000 mg | ORAL_TABLET | Freq: Two times a day (BID) | ORAL | 0 refills | Status: AC

## 2021-01-06 MED ORDER — ADULT MULTIVITAMIN W/MINERALS CH
1.0000 | ORAL_TABLET | Freq: Every day | ORAL | Status: DC
  Administered 2021-01-06: 1 via ORAL
  Filled 2021-01-06: qty 1

## 2021-01-06 MED ORDER — PREDNISONE 20 MG PO TABS: 50.0000 mg | ORAL_TABLET | Freq: Every day | ORAL | Status: DC

## 2021-01-06 MED ORDER — ZINC SULFATE 220 (50 ZN) MG PO CAPS: 220.0000 mg | ORAL_CAPSULE | Freq: Every day | ORAL | 1 refills | Status: DC

## 2021-01-06 MED ORDER — PREDNISONE 20 MG PO TABS: 40.0000 mg | ORAL_TABLET | Freq: Every day | ORAL | 0 refills | Status: AC

## 2021-01-06 MED ORDER — PANTOPRAZOLE SODIUM 40 MG PO TBEC: 40.0000 mg | DELAYED_RELEASE_TABLET | Freq: Every day | ORAL | 0 refills | Status: DC

## 2021-01-06 MED ORDER — GUAIFENESIN-DM 100-10 MG/5ML PO SYRP: 10.0000 mL | ORAL_SOLUTION | ORAL | Status: DC | PRN

## 2021-01-06 MED ORDER — ENOXAPARIN SODIUM 80 MG/0.8ML IJ SOSY: 80.0000 mg | PREFILLED_SYRINGE | INTRAMUSCULAR | Status: DC

## 2021-01-06 MED ORDER — ZINC SULFATE 220 (50 ZN) MG PO CAPS
220.0000 mg | ORAL_CAPSULE | Freq: Every day | ORAL | Status: DC
  Administered 2021-01-06: 220 mg via ORAL
  Filled 2021-01-06: qty 1

## 2021-01-06 MED ORDER — ACETAMINOPHEN 325 MG PO TABS: 650.0000 mg | ORAL_TABLET | Freq: Four times a day (QID) | ORAL | Status: DC | PRN

## 2021-01-06 MED ORDER — PANTOPRAZOLE SODIUM 40 MG PO TBEC: 40.0000 mg | DELAYED_RELEASE_TABLET | Freq: Every day | ORAL | Status: DC

## 2021-01-06 MED ORDER — CEFDINIR 300 MG PO CAPS: 300.0000 mg | ORAL_CAPSULE | Freq: Two times a day (BID) | ORAL | 0 refills | Status: AC

## 2021-01-06 MED ORDER — INSULIN ASPART 100 UNIT/ML IJ SOLN: 0.0000 [IU] | Freq: Every day | INTRAMUSCULAR | Status: DC

## 2021-01-06 MED ORDER — AZITHROMYCIN 500 MG PO TABS: 500.0000 mg | ORAL_TABLET | Freq: Every day | ORAL | 0 refills | Status: AC

## 2021-01-06 MED ORDER — ACETAMINOPHEN 325 MG PO TABS: 650.0000 mg | ORAL_TABLET | Freq: Four times a day (QID) | ORAL | 0 refills | Status: AC | PRN

## 2021-01-06 MED ORDER — GLUCERNA SHAKE PO LIQD: 237.0000 mL | Freq: Three times a day (TID) | ORAL | Status: DC

## 2021-01-06 MED ORDER — ASCORBIC ACID 500 MG PO TABS
500.0000 mg | ORAL_TABLET | Freq: Every day | ORAL | Status: DC
  Administered 2021-01-06: 500 mg via ORAL
  Filled 2021-01-06: qty 1

## 2021-01-06 MED ORDER — HYDROCOD POLST-CPM POLST ER 10-8 MG/5ML PO SUER: 5.0000 mL | Freq: Two times a day (BID) | ORAL | Status: DC | PRN

## 2021-01-06 MED ORDER — ASCORBIC ACID 500 MG PO TABS: 500.0000 mg | ORAL_TABLET | Freq: Every day | ORAL | 2 refills | Status: DC

## 2021-01-06 MED ORDER — ONDANSETRON HCL 4 MG/2ML IJ SOLN: 4.0000 mg | Freq: Four times a day (QID) | INTRAMUSCULAR | Status: DC | PRN

## 2021-01-06 MED ORDER — ALBUTEROL SULFATE HFA 108 (90 BASE) MCG/ACT IN AERS: 2.0000 | INHALATION_SPRAY | Freq: Four times a day (QID) | RESPIRATORY_TRACT | 0 refills | Status: AC

## 2021-01-06 MED ORDER — ONDANSETRON HCL 4 MG PO TABS: 4.0000 mg | ORAL_TABLET | Freq: Four times a day (QID) | ORAL | Status: DC | PRN

## 2021-01-06 MED ORDER — ADULT MULTIVITAMIN W/MINERALS CH: 1.0000 | ORAL_TABLET | Freq: Every day | ORAL | 1 refills | Status: DC

## 2021-01-06 NOTE — Discharge Summary (Signed)
Caroline Yoder, is a 30 y.o. female  DOB 05/19/1991  MRN [id number].  Admission date:  01/05/2021  Admitting Physician  Frankey Shown, DO  Discharge Date:  01/06/2021   Primary MD  Gareth Morgan, MD  Recommendations for primary care physician for things to follow:   -Patient left AMA despite advice to the contrary  Admission Diagnosis  Pneumonia due to COVID-19 virus [U07.1, J12.82] COVID-19 [U07.1]   Discharge Diagnosis  Pneumonia due to COVID-19 virus [U07.1, J12.82] COVID-19 [U07.1]    Principal Problem:   CAP (community acquired pneumonia) Active Problems:   Pneumonia due to COVID-19 virus   Sepsis (HCC)   Acute respiratory failure with hypoxia (HCC)   Hyperglycemia   Hypoalbuminemia due to protein-calorie malnutrition [facility])      Past Medical History:  Diagnosis Date  . Abnormal uterine bleeding (AUB) 05/23/2015  . Contraceptive management 05/23/2015  . Hx of chlamydia infection   . Menorrhagia 02/11/2014  . Obesity   . Trichimoniasis 05/23/2015  . Vaginal discharge 05/23/2015    History reviewed. No pertinent surgical history.     HPI  from the history and physical done on the day of admission:    Chief Complaint: Cough  HPI: Caroline Yoder is a 30 y.o. female with no significant medical history who presents to the emergency department from an urgent care due to left lower lobe pneumonia.  Patient complained of 3-day onset of generalized body aches, fatigue, headache and left rib and left back pain with deep breathing and coughing.  NyQuil and honey taken at home with no relief.  Cough was occasionally productive with yellow phlegm.  She states that she got 2 COVID vaccines, but she has not received the booster.  This morning, she noted that she was more short of breath when she walks, but breathing was fine when she sits, so she went to an urgent care, where she was noted to be  tachycardic and was in respiratory distress due to some wheezing and decreased breath sounds.  Chest x-ray was done and patient was found to have left lower lobe pneumonia, Solu-Medrol 125 mg IM was given, oxygen dropped to 86% on ambulation and she was provided with supplemental oxygen via [place] at 4 LPM with improvement in O2 sats to 97%.  She was then referred to the ED for further evaluation.  ED Course:  In the emergency department, she was febrile with a temperature of 101.74F, tachycardic and intermittently tachypneic, BP was elevated at 170/91, O2 sat was 93-100 on 4 LPM of oxygen.  Work-up in the ED showed leukocytosis, normocytic anemia, hyperglycemia, hypoalbuminemia, procalcitonin was 0.59, troponin 3 > 4, CRP 12.3, D-dimer 0.34, fibrinogen 596, LDH 203.  SARS coronavirus 2 was positive. Chest x-ray showed left lung base pneumonia She was treated with Decadron and started IV remdesivir.  She was started on IV ceftriaxone and azithromycin for community-acquired pneumonia.  Tylenol was given due to fever.  Hospitalist was asked to admit patient for further evaluation and management.  Hospital Course:     1)Acute Hypoxic Respiratory failure secondary to community-acquired pneumonia with concomitant COVID-19 infection-- -chest x-ray consistent with left-sided pneumonia -Patient received Rocephin and azithromycin -Hypoxia resolved -Patient left AMA despite advice to the contrary -Gave Rx for Omnicef and azithromycin  2)CoViD 19 infection--- patient completed 2 doses of Moderna in March and April 2022 -Elevated D-dimer noted --D-Dimer trending up  despite steroids and antibiotics - -Patient declines lower extremity venous Dopplers --Patient left AMA despite advice to the contrary  COVID-19 Labs  Recent Labs    01/05/21 2145 01/06/21 0414  DDIMER 0.34 2.08*  FERRITIN 43 54  LDH 203*  --   CRP 12.3* 17.0*    Lab Results  Component Value Date   SARSCOV2NAA POSITIVE (A)  01/05/2021   SARSCOV2NAA Not Detected 09/30/2020    3)Morbid Obesity- -Low calorie diet, portion control and increase physical activity discussed with patient -Body mass index is 67.02 kg/m.  4)Hyperglycemia--- suspect glucose intolerance, possible metabolic syndrome X -Anticipate worsening glycemic control with steroids, metformin prescribed -Follow-up with PCP for further management  Discharge Condition: stable  Follow UP--- PCP after quarantine.  Follow-up Information    Gareth MorganKnowlton, Steve, MD .   Specialty: [facility] Contact information: [address] [place] [address] [phone]             Diet and Activity recommendation:  As advised  Discharge Instructions    Discharge Instructions    Call MD for:  difficulty breathing, headache or visual disturbances   Complete by: As directed    Call MD for:  persistant dizziness or light-headedness   Complete by: As directed    Call MD for:  persistant nausea and vomiting   Complete by: As directed    Call MD for:  temperature >100.4   Complete by: As directed    Diet - low sodium heart healthy   Complete by: As directed    Diet Carb Modified   Complete by: As directed    Discharge instructions   Complete by: As directed    1) You are strongly advised to isolate/quarantine for at least 10 days from the date of your diagnosis with COVID-19 infection--please always wear a mask if you have to go outside the house  2) please follow-up with your primary care physician after your isolation  -You can do Video/Virtual follow-up visit with primary care physician in about a week -Please wait at least 3 weeks prior to doing a face-to-face visit with the primary care physician) .  For recheck and reevaluation  of your pneumonia and possible diabetic work-up  3)Please take medications as prescribed   Increase activity slowly   Complete by: As directed        Discharge Medications     Allergies as of  01/06/2021      Reactions   Strawberry Flavor Anaphylaxis      Medication List    STOP taking these medications   ibuprofen 800 MG tablet Commonly known as: ADVIL   megestrol 40 MG tablet Commonly known as: MEGACE     TAKE these medications   acetaminophen 325 MG tablet Commonly known as: TYLENOL Take 2 tablets (650 mg total) by mouth every 6 (six) hours as needed for mild pain, fever or headache (fever >/= 101).   albuterol 108 (90 Base) MCG/ACT inhaler Commonly known as: VENTOLIN HFA Inhale 2 puffs into the lungs every 6 (six) hours.   ascorbic acid 500 MG tablet Commonly known as:  VITAMIN C Take 1 tablet (500 mg total) by mouth daily. Start taking on: Jan 07, 2021   azithromycin 500 MG tablet Commonly known as: ZITHROMAX Take 1 tablet (500 mg total) by mouth daily for 3 days.   cefdinir 300 MG capsule Commonly known as: OMNICEF Take 1 capsule (300 mg total) by mouth 2 (two) times daily for 5 days.   guaiFENesin 600 MG 12 hr tablet Commonly known as: Mucinex Take 1 tablet (600 mg total) by mouth 2 (two) times daily for 10 days.   metFORMIN 500 MG tablet Commonly known as: Glucophage Take 1 tablet (500 mg total) by mouth 2 (two) times daily with a meal. For  High Blood sugar and Metabolic Syndrome X   multivitamin with minerals Tabs tablet Take 1 tablet by mouth daily. Start taking on: Jan 07, 2021   pantoprazole 40 MG tablet Commonly known as: PROTONIX Take 1 tablet (40 mg total) by mouth daily.   predniSONE 20 MG tablet Commonly known as: DELTASONE Take 2 tablets (40 mg total) by mouth daily with breakfast for 5 days.   zinc sulfate 220 (50 Zn) MG capsule Take 1 capsule (220 mg total) by mouth daily. Start taking on: Jan 07, 2021      Major procedures and Radiology Reports - PLEASE review detailed and final reports for all details, in brief -   DG Chest 2 View  Result Date: 01/05/2021 CLINICAL DATA:  Shortness of breath and cough EXAM: CHEST - 2  VIEW COMPARISON:  None available FINDINGS: Consolidation left lung base. Right lung grossly clear. Normal cardiomediastinal silhouette. No pneumothorax IMPRESSION: Left lung base pneumonia Electronically Signed   By: Jasmine Pang M.D.   On: 01/05/2021 19:59    Micro Results    Recent Results (from the past 240 hour(s))  Resp Panel by RT-PCR (Flu A&B, Covid) Nasopharyngeal Swab     Status: Abnormal   Collection Time: 01/05/21  9:14 PM   Specimen: Nasopharyngeal Swab; Nasopharyngeal(NP) swabs in vial transport medium  Result Value Ref Range Status   SARS Coronavirus 2 by RT PCR POSITIVE (A) NEGATIVE Final    Comment: RESULT CALLED TO, READ BACK BY AND VERIFIED WITH: A HARRY,RN@[id number]  01/05/21 MKELLY (NOTE) SARS-CoV-2 target nucleic acids are DETECTED.  The SARS-CoV-2 RNA is generally detectable in upper respiratory specimens during the acute phase of infection. Positive results are indicative of the presence of the identified virus, but do not rule out bacterial infection or co-infection with other pathogens not detected by the test. Clinical correlation with patient history and other diagnostic information is necessary to determine patient infection status. The expected result is Negative.  Fact Sheet for Patients: BloggerCourse.com  Fact Sheet for Healthcare Providers: SeriousBroker.it  This test is not yet approved or cleared by the [place] FDA and  has been authorized for detection and/or diagnosis of SARS-CoV-2 by FDA under an Emergency Use Authorization (EUA).  This EUA will remain in effect (meaning this test can be u sed) for the duration of  the COVID-19 declaration under Section 564(b)(1) of the Act, 21 U.S.C. section 360bbb-3(b)(1), unless the authorization is terminated or revoked sooner.     Influenza A by PCR NEGATIVE NEGATIVE Final   Influenza B by PCR NEGATIVE NEGATIVE Final    Comment: (NOTE) The Xpert  Xpress SARS-CoV-2/FLU/RSV plus assay is intended as an aid in the diagnosis of influenza from Nasopharyngeal swab specimens and should not be used as a sole basis for treatment. Nasal washings and aspirates  are unacceptable for Xpert Xpress SARS-CoV-2/FLU/RSV testing.  Fact Sheet for Patients: BloggerCourse.com  Fact Sheet for Healthcare Providers: SeriousBroker.it  This test is not yet approved or cleared by the [place] FDA and has been authorized for detection and/or diagnosis of SARS-CoV-2 by FDA under an Emergency Use Authorization (EUA). This EUA will remain in effect (meaning this test can be used) for the duration of the COVID-19 declaration under Section 564(b)(1) of the Act, 21 U.S.C. section 360bbb-3(b)(1), unless the authorization is terminated or revoked.  Performed at [facility], [address] [place], [place] [address]   Blood Culture (routine x 2)     Status: None (Preliminary result)   Collection Time: 01/05/21  9:19 PM   Specimen: BLOOD RIGHT FOREARM  Result Value Ref Range Status   Specimen Description BLOOD RIGHT FOREARM  Final   Special Requests   Final    BOTTLES DRAWN AEROBIC AND ANAEROBIC Blood Culture results may not be optimal due to an inadequate volume of blood received in culture bottles   Culture   Final    NO GROWTH < 12 HOURS Performed at [facility], [address], [place], [place] [address]    Report Status PENDING  Incomplete  Blood Culture (routine x 2)     Status: None (Preliminary result)   Collection Time: 01/05/21  9:45 PM   Specimen: BLOOD  Result Value Ref Range Status   Specimen Description BLOOD RIGHT ANTECUBITAL  Final   Special Requests   Final    BOTTLES DRAWN AEROBIC AND ANAEROBIC Blood Culture results may not be optimal due to an excessive volume of blood received in culture bottles   Culture   Final    NO GROWTH < 12 HOURS Performed at [facility], [address], [place], [place] [address]    Report Status PENDING  Incomplete  MRSA PCR Screening     Status: None   Collection Time: 01/06/21  1:16 AM   Specimen: Nasal Mucosa; Nasopharyngeal  Result Value Ref Range Status   MRSA by PCR NEGATIVE NEGATIVE Final    Comment:        The GeneXpert MRSA Assay (FDA approved for NASAL specimens only), is one component of a comprehensive MRSA colonization surveillance program. It is not intended to diagnose MRSA infection nor to guide or monitor treatment for MRSA infections. Performed at [facility], [address], [place], [place] [address]     Today   Subjective    Kendyl Newcom today has no new complaints  --Elevated D-dimer noted ----D-Dimer trending up  despite steroids and antibiotics - -Patient declines lower extremity venous Dopplers --Patient left AMA despite advice to the contrary   Patient has been seen and examined prior to discharge   Objective   Blood pressure (!) 159/85, pulse 99, temperature 98.5 F (36.9 C), temperature source Oral, resp. rate (!) 25, height  (1.626 m), weight (!) 177.1 kg, last menstrual period 12/29/2020, SpO2 100 %.   Intake/Output Summary (Last 24 hours) at 01/06/2021 1155 Last data filed at 01/06/2021 0800 Gross per 24 hour  Intake 683.05 ml  Output --  Net 683.05 ml    Exam Gen:- Awake Alert, no acute distress, morbidly obese HEENT:- [place].AT, No sclera icterus Neck-Supple Neck,No JVD,.  Lungs-diminished breath sounds, no wheezing CV- S1, S2 normal, regular Abd-  +ve B.Sounds, Abd Soft, No tenderness,   increased truncal adiposity Extremity/Skin:- No  edema,   good pulses Psych-affect is appropriate, oriented x3 Neuro-no new  focal deficits, no tremors \   Data Review   CBC w Diff:  Lab Results  Component Value Date   WBC 12.4 (H) 01/06/2021   HGB 10.8 (L) 01/06/2021   HCT 36.7 01/06/2021   PLT 308 01/06/2021   LYMPHOPCT 3 01/06/2021   MONOPCT 2 01/06/2021   EOSPCT 0 01/06/2021    BASOPCT 0 01/06/2021    CMP:  Lab Results  Component Value Date   NA 137 01/06/2021   K 3.7 01/06/2021   CL 101 01/06/2021   CO2 25 01/06/2021   BUN 7 01/06/2021   CREATININE 0.80 01/06/2021   PROT 7.6 01/06/2021   ALBUMIN 3.3 (L) 01/06/2021   BILITOT 0.3 01/06/2021   ALKPHOS 91 01/06/2021   AST 19 01/06/2021   ALT 9 01/06/2021  .   Total Discharge time is about 33 minutes  Shon Hale M.D on 01/06/2021 at 11:55 AM  Go to [email or web address] -  for contact info  [facility] - Office  [phone]

## 2021-01-06 NOTE — Progress Notes (Signed)
Patient is leaving AMA against medical advice and was given possible outcomes of leaving but she was insistent. Did agree to get IV antibiotic  Dr. Mariea Clonts recommended. Patient dressed herself and I removed IV access. Patient retrieved her belongings and was able to walk out to friends who was coming to pick her up.

## 2021-01-06 NOTE — Progress Notes (Signed)
Refused finger sticks and said she is not diabetic and does not want insulin.

## 2021-01-06 NOTE — Discharge Instructions (Signed)
1) You are strongly advised to isolate/quarantine for at least 10 days from the date of your diagnosis with COVID-19 infection--please always wear a mask if you have to go outside the house  2) please follow-up with your primary care physician after your isolation  -You can do Video/Virtual follow-up visit with primary care physician in about a week -Please wait at least 3 weeks prior to doing a face-to-face visit with the primary care physician) .  For recheck and reevaluation  of your pneumonia and possible diabetic work-up  3)Please take medications as prescribed

## 2021-01-10 LAB — CULTURE, BLOOD (ROUTINE X 2)
Culture: NO GROWTH
Culture: NO GROWTH

## 2021-05-29 ENCOUNTER — Ambulatory Visit (HOSPITAL_COMMUNITY)
Admission: EM | Admit: 2021-05-29 | Discharge: 2021-05-29 | Disposition: A | Discharge status: Home / Self Care | Payer: Medicare HMO | Attending: Emergency Medicine | Admitting: Emergency Medicine

## 2021-05-29 ENCOUNTER — Encounter (HOSPITAL_COMMUNITY): Payer: Self-pay

## 2021-05-29 ENCOUNTER — Other Ambulatory Visit: Payer: Self-pay

## 2021-05-29 DIAGNOSIS — S161XXA Strain of muscle, fascia and tendon at neck level, initial encounter: Secondary | ICD-10-CM

## 2021-05-29 MED ORDER — IBUPROFEN 800 MG PO TABS: 800.0000 mg | ORAL_TABLET | Freq: Three times a day (TID) | ORAL | 0 refills | Status: DC
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
## 2021-05-29 NOTE — Discharge Instructions (Signed)
Take the ibuprofen 3 times a day as needed for pain relief. You may also take Tylenol as needed for pain relief and fever reduction.  Make sure you are drinking plenty of fluids and rest is much as possible over the next few days.  You can use heat, ice, or alternate between heat and ice as needed for comfort.  You may also use IcyHot, lidocaine patches, Biofreeze, Aspercreme, or Voltaren gel as needed for pain relief.  Follow-up with your primary care provider for reevaluation.  If your symptoms do not improve you can follow-up with orthopedics.  If you develop the worse headache of your life, worsening dizziness, nausea/vomiting, blurred vision, slurred speech, difficulty walking, weakness on one side, chest pain, shortness of breath, or altered mental status, call 911 or go directly to the Emergency Department for further evaluation.

## 2021-05-31 ENCOUNTER — Encounter (HOSPITAL_COMMUNITY): Payer: Self-pay | Admitting: Emergency Medicine

## 2021-05-31 ENCOUNTER — Other Ambulatory Visit: Payer: Self-pay

## 2021-05-31 ENCOUNTER — Emergency Department (HOSPITAL_COMMUNITY)
Admission: EM | Admit: 2021-05-31 | Discharge: 2021-05-31 | Disposition: A | Discharge status: Home / Self Care | Payer: Medicare HMO | Attending: Emergency Medicine | Admitting: Emergency Medicine

## 2021-05-31 ENCOUNTER — Emergency Department (HOSPITAL_COMMUNITY): Payer: Medicare HMO

## 2021-05-31 DIAGNOSIS — Y92410 Unspecified street and highway as the place of occurrence of the external cause: Secondary | ICD-10-CM | POA: Diagnosis not present

## 2021-05-31 DIAGNOSIS — Z7984 Long term (current) use of oral hypoglycemic drugs: Secondary | ICD-10-CM | POA: Insufficient documentation

## 2021-05-31 DIAGNOSIS — S161XXA Strain of muscle, fascia and tendon at neck level, initial encounter: Secondary | ICD-10-CM | POA: Diagnosis not present

## 2021-05-31 DIAGNOSIS — S199XXA Unspecified injury of neck, initial encounter: Secondary | ICD-10-CM | POA: Diagnosis present

## 2021-05-31 DIAGNOSIS — M545 Low back pain, unspecified: Secondary | ICD-10-CM | POA: Insufficient documentation

## 2021-05-31 MED ORDER — METHOCARBAMOL 500 MG PO TABS: 500.0000 mg | ORAL_TABLET | Freq: Three times a day (TID) | ORAL | 0 refills | Status: DC

## 2021-05-31 NOTE — ED Notes (Signed)
Patient transported to X-ray 
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
## 2021-05-31 NOTE — Discharge Instructions (Addendum)
The x-rays of your neck today were reassuring.  Alternate ice and heat to your neck.  You may take the muscle relaxer as prescribed.  Follow-up with your primary care provider for recheck in 1 week if not improving.

## 2021-06-24 ENCOUNTER — Emergency Department (HOSPITAL_COMMUNITY)
Admission: EM | Admit: 2021-06-24 | Discharge: 2021-06-24 | Disposition: A | Discharge status: Home / Self Care | Payer: Medicare HMO | Attending: Emergency Medicine | Admitting: Emergency Medicine

## 2021-06-24 ENCOUNTER — Other Ambulatory Visit: Payer: Self-pay

## 2021-06-24 ENCOUNTER — Encounter (HOSPITAL_COMMUNITY): Payer: Self-pay | Admitting: Emergency Medicine

## 2021-06-24 DIAGNOSIS — S8012XS Contusion of left lower leg, sequela: Secondary | ICD-10-CM

## 2021-06-24 DIAGNOSIS — S8992XD Unspecified injury of left lower leg, subsequent encounter: Secondary | ICD-10-CM | POA: Diagnosis present

## 2021-06-24 DIAGNOSIS — S8012XD Contusion of left lower leg, subsequent encounter: Secondary | ICD-10-CM | POA: Diagnosis not present

## 2021-06-24 NOTE — Discharge Instructions (Signed)
Please return to the emergency department if you experience worsening pain, trouble walking, increase in bruising, or any other concerns you might have.  Please follow-up with your primary care provider for further evaluation.
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
## 2022-06-30 ENCOUNTER — Ambulatory Visit
Admission: EM | Admit: 2022-06-30 | Discharge: 2022-06-30 | Disposition: A | Discharge status: Home / Self Care | Payer: Medicare Other | Attending: Nurse Practitioner | Admitting: Nurse Practitioner

## 2022-06-30 DIAGNOSIS — R600 Localized edema: Secondary | ICD-10-CM | POA: Diagnosis not present

## 2022-06-30 DIAGNOSIS — R29898 Other symptoms and signs involving the musculoskeletal system: Secondary | ICD-10-CM | POA: Diagnosis not present

## 2022-06-30 NOTE — Discharge Instructions (Addendum)
As discussed, please follow-up with your primary care physician for further evaluation for the swelling in your legs and feet.  You have pitting edema that was seen on your exam today. Recommend purchasing compression socks/hose to help with the swelling in your legs and feet.  You can purchase these online on [employer].  When she began wearing them, wear them for 12 hours or while you are working, then remove them at nighttime during sleep. Keep the legs and feet elevated above the level of the heart is much as possible. Go to the emergency department if you experience shortness of breath, difficulty breathing, chest pain, or worsening swelling in your legs in which you cannot walk. Please follow-up with your primary care physician within the next 7 to 10 days.  For your left knee: Wear the Ace wrap with prolonged activity such as standing or walking. May take over-the-counter Tylenol as needed for pain or discomfort. If you have swelling in the knee, apply ice.  Apply for 20 minutes, remove for 1 hour, then repeat is much as possible. As discussed, if you continue to experience symptoms in the left knee, please follow-up with orthopedics or with your primary care physician for further evaluation. Follow-up as needed.
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
## 2022-06-30 NOTE — ED Triage Notes (Signed)
Pt reports swelling I ankles and feet x 2 days; left knee "pop" sometimes when walking x 2 months.

## 2022-07-06 ENCOUNTER — Ambulatory Visit (HOSPITAL_COMMUNITY)
Admission: RE | Admit: 2022-07-06 | Discharge: 2022-07-06 | Disposition: A | Discharge status: Home / Self Care | Payer: Medicare Other | Source: Ambulatory Visit | Attending: Nurse Practitioner | Admitting: Nurse Practitioner

## 2022-07-06 ENCOUNTER — Other Ambulatory Visit (HOSPITAL_COMMUNITY): Payer: Self-pay | Admitting: Nurse Practitioner

## 2022-07-06 DIAGNOSIS — M25561 Pain in right knee: Secondary | ICD-10-CM | POA: Insufficient documentation

## 2022-07-06 DIAGNOSIS — M25562 Pain in left knee: Secondary | ICD-10-CM | POA: Diagnosis present

## 2022-07-06 DIAGNOSIS — I4902 Ventricular flutter: Secondary | ICD-10-CM

## 2022-10-30 ENCOUNTER — Ambulatory Visit
Admission: EM | Admit: 2022-10-30 | Discharge: 2022-10-30 | Disposition: A | Discharge status: Home / Self Care | Payer: 59

## 2022-10-30 ENCOUNTER — Encounter: Payer: Self-pay | Admitting: Emergency Medicine

## 2022-10-30 ENCOUNTER — Ambulatory Visit (INDEPENDENT_AMBULATORY_CARE_PROVIDER_SITE_OTHER): Payer: 59

## 2022-10-30 DIAGNOSIS — M25512 Pain in left shoulder: Secondary | ICD-10-CM

## 2022-10-30 DIAGNOSIS — M79622 Pain in left upper arm: Secondary | ICD-10-CM | POA: Diagnosis not present

## 2022-10-30 DIAGNOSIS — M67814 Other specified disorders of tendon, left shoulder: Secondary | ICD-10-CM | POA: Diagnosis not present

## 2022-10-30 HISTORY — DX: Depression, unspecified: F32.A

## 2022-10-30 HISTORY — DX: Type 2 diabetes mellitus without complications: E11.9

## 2022-10-30 MED ORDER — METHOCARBAMOL 500 MG PO TABS: 500.0000 mg | ORAL_TABLET | Freq: Two times a day (BID) | ORAL | 0 refills | Status: DC

## 2022-10-30 MED ORDER — PREDNISONE 20 MG PO TABS: 40.0000 mg | ORAL_TABLET | Freq: Every day | ORAL | 0 refills | Status: AC

## 2022-10-30 NOTE — Discharge Instructions (Addendum)
The x-ray is negative for fracture or dislocation.  The x-ray suggest that you may have tendinosis in the left shoulder.  This most likely is contributing to the cause of your pain. Take medication as prescribed. Recommend continuing over-the-counter Tylenol arthritis strength 650 mg tablets to help with left arm pain or discomfort. Gentle stretching and range of motion exercises to help improve mobility of your left shoulder. Try to keep the shoulder and left arm moving to prevent further pain. If symptoms do not improve with this treatment, recommend following up with orthopedics.  You can follow-up with [facility] [place] at [phone] or with [facility] at [phone]. Follow-up as needed.

## 2022-10-30 NOTE — ED Provider Notes (Signed)
[facility]    CSN: [id number] Arrival date & time: 10/30/22  1357      History   Chief Complaint No chief complaint on file.   HPI Caroline Yoder is a 32 y.o. female.   The history is provided by the patient.   The patient presents with a 3-day history of left arm pain.  Patient states that she woke up 1 morning and her arm was like this.  She states pain is in the left shoulder and radiates to the top of her left arm.  She states she also has difficulty raising the arm removing it.  She complains of intermittent numbness and tingling.  She reports that she does not have any swelling, redness, or numbness and tingling at this time.  Patient reports she is right-hand dominant.  She reports she has been taking over-the-counter ibuprofen for her pain with minimal relief.  Past Medical History:  Diagnosis Date   Abnormal uterine bleeding (AUB) 05/23/2015   Contraceptive management 05/23/2015   Depression    Diabetes mellitus without complication ([place])    Hx of chlamydia infection    Menorrhagia 02/11/2014   Obesity    Trichimoniasis 05/23/2015   Vaginal discharge 05/23/2015    Patient Active Problem List   Diagnosis Date Noted   CAP (community acquired pneumonia) 01/06/2021   Sepsis ([place]) 01/06/2021   Acute respiratory failure with hypoxia ([place]) 01/06/2021   Hyperglycemia 01/06/2021   Hypoalbuminemia due to protein-calorie malnutrition ([place]) 01/06/2021   Pneumonia due to COVID-19 virus 01/05/2021   Morbid obesity ([place]) 01/11/2018   Screening examination for STD (sexually transmitted disease) 01/11/2018   Pregnancy examination or test, negative result 01/11/2018   BV (bacterial vaginosis) 08/19/2015   Abnormal uterine bleeding (AUB) 05/23/2015   Vaginal discharge 05/23/2015   Trichimoniasis 05/23/2015   Contraceptive management 05/23/2015   Menorrhagia 02/11/2014   Hx of chlamydia infection 12/28/2012    History reviewed. No pertinent surgical  history.  OB History     Gravida  0   Para      Term      Preterm      AB      Living         SAB      IAB      Ectopic      Multiple      Live Births               Home Medications    Prior to Admission medications   Medication Sig Start Date End Date Taking? Authorizing Provider  escitalopram (LEXAPRO) 5 MG tablet Take 5 mg by mouth daily.   Yes [provider]  furosemide (LASIX) 20 MG tablet Take 20 mg by mouth.   Yes [provider]  methocarbamol (ROBAXIN) 500 MG tablet Take 1 tablet (500 mg total) by mouth 2 (two) times daily. 10/30/22  Yes Jillaine Waren-Warren, Alda Lea, NP  predniSONE (DELTASONE) 20 MG tablet Take 2 tablets (40 mg total) by mouth daily with breakfast for 5 days. 10/30/22 11/04/22 Yes Clive Parcel-Warren, Alda Lea, NP  acetaminophen (TYLENOL) 325 MG tablet Take 2 tablets (650 mg total) by mouth every 6 (six) hours as needed for mild pain, fever or headache (fever >/= 101). 01/06/21   Roxan Hockey, MD  albuterol (VENTOLIN HFA) 108 (90 Base) MCG/ACT inhaler Inhale 2 puffs into the lungs every 6 (six) hours. 01/06/21   Roxan Hockey, MD  ascorbic acid (VITAMIN C) 500 MG tablet Take 1  tablet (500 mg total) by mouth daily. 01/07/21   Roxan Hockey, MD  ibuprofen (ADVIL) 800 MG tablet Take 1 tablet (800 mg total) by mouth 3 (three) times daily. 05/29/21   Pearson Forster, NP  metFORMIN (GLUCOPHAGE) 500 MG tablet Take 1 tablet (500 mg total) by mouth 2 (two) times daily with a meal. For  High Blood sugar and Metabolic Syndrome X [id number] 01/06/22  Roxan Hockey, MD  Multiple Vitamin (MULTIVITAMIN WITH MINERALS) TABS tablet Take 1 tablet by mouth daily. 01/07/21   Roxan Hockey, MD  pantoprazole (PROTONIX) 40 MG tablet Take 1 tablet (40 mg total) by mouth daily. 01/06/21   Roxan Hockey, MD  zinc sulfate 220 (50 Zn) MG capsule Take 1 capsule (220 mg total) by mouth daily. 01/07/21   Roxan Hockey, MD    Family History Family History   Problem Relation Age of Onset   Diabetes Mother    Thyroid disease Mother    Thyroid disease Brother    Diabetes Brother    Thyroid disease Brother    Seizures Brother    Diabetes Brother    Hypertension Maternal Grandmother    Diabetes Other     Social History Social History   Tobacco Use   Smoking status: Never   Smokeless tobacco: Never  Vaping Use   Vaping Use: Never used  Substance Use Topics   Alcohol use: No   Drug use: Never     Allergies   Strawberry flavor   Review of Systems Review of Systems Per HPI  Physical Exam Triage Vital Signs ED Triage Vitals  Enc Vitals Group     BP 10/30/22 1515 132/79     Pulse Rate 10/30/22 1515 79     Resp 10/30/22 1515 20     Temp 10/30/22 1515 98.3 F (36.8 C)     Temp Source 10/30/22 1515 Oral     SpO2 10/30/22 1515 92 %     Weight --      Height --      Head Circumference --      Peak Flow --      Pain Score 10/30/22 1516 8     Pain Loc --      Pain Edu? --      Excl. in [place]? --    No data found.  Updated Vital Signs BP 132/79 (BP Location: Right Arm)   Pulse 79   Temp 98.3 F (36.8 C) (Oral)   Resp 20   SpO2 92%   Visual Acuity Right Eye Distance:   Left Eye Distance:   Bilateral Distance:    Right Eye Near:   Left Eye Near:    Bilateral Near:     Physical Exam Vitals and nursing note reviewed.  Constitutional:      General: She is not in acute distress.    Appearance: Normal appearance.  Pulmonary:     Effort: Pulmonary effort is normal.  Musculoskeletal:     Left shoulder: Tenderness present. No swelling or deformity. Decreased range of motion. Normal strength. Normal pulse.     Left upper arm: Tenderness present. No swelling.     Cervical back: Normal range of motion.  Skin:    General: Skin is warm and dry.  Neurological:     General: No focal deficit present.     Mental Status: She is alert and oriented to person, place, and time.  Psychiatric:        Mood and Affect: Mood  normal.  Behavior: Behavior normal.      UC Treatments / Results  Labs (all labs ordered are listed, but only abnormal results are displayed) Labs Reviewed - No data to display  EKG   Radiology DG Shoulder Left  Result Date: 10/30/2022 CLINICAL DATA:  Left shoulder pain for 3 days.  No known injury EXAM: LEFT SHOULDER - 2+ VIEW COMPARISON:  None Available. FINDINGS: There is no evidence of fracture or dislocation. Calcification along the supraspinatus tendon suggesting calcific tendinosis. Soft tissues are unremarkable. IMPRESSION: Calcification along the supraspinatus tendon suggesting calcific tendinosis. Electronically Signed   By: Keane Police D.O.   On: 10/30/2022 16:09    Procedures Procedures (including critical care time)  Medications Ordered in UC Medications - No data to display  Initial Impression / Assessment and Plan / UC Course  I have reviewed the triage vital signs and the nursing notes.  Pertinent labs & imaging results that were available during my care of the patient were reviewed by me and considered in my medical decision making (see chart for details).  The patient is well-appearing, she is in no acute distress, vital signs are stable.  X-ray of the left shoulder shows calcification along the supraspinatus tendon suggestive of calcific tendinosis.  Cannot rule out cause of the patient's pain.  Will provide patient symptomatic treatment with prednisone 40 mg for the next 5 days and methocarbamol 500 mg to help with spasm and tightness.  Supportive care recommendations were provided to the patient along with recommendations for her to follow-up with orthopedics for further evaluation if symptoms fail to improve.  Patient was given information for [facility] [place] and for [facility].  Patient verbalizes understanding and is in agreement with this plan of care.  All questions were answered.  Patient stable for discharge.  Final Clinical Impressions(s) /  UC Diagnoses   Final diagnoses:  Left shoulder pain, unspecified chronicity  Left upper arm pain  Tendinosis of left shoulder     Discharge Instructions      The x-ray is negative for fracture or dislocation.  The x-ray suggest that you may have tendinosis in the left shoulder.  This most likely is contributing to the cause of your pain. Take medication as prescribed. Recommend continuing over-the-counter Tylenol arthritis strength 650 mg tablets to help with left arm pain or discomfort. Gentle stretching and range of motion exercises to help improve mobility of your left shoulder. Try to keep the shoulder and left arm moving to prevent further pain. If symptoms do not improve with this treatment, recommend following up with orthopedics.  You can follow-up with [facility] [place] at [phone] or with [facility] at [phone]. Follow-up as needed.      ED Prescriptions     Medication Sig Dispense Auth. Provider   predniSONE (DELTASONE) 20 MG tablet Take 2 tablets (40 mg total) by mouth daily with breakfast for 5 days. 10 tablet Kimyetta Flott-Warren, Alda Lea, NP   methocarbamol (ROBAXIN) 500 MG tablet Take 1 tablet (500 mg total) by mouth 2 (two) times daily. 20 tablet Lavida Patch-Warren, Alda Lea, NP      PDMP not reviewed this encounter.   Tish Men, NP 10/30/22 1615

## 2022-10-30 NOTE — ED Triage Notes (Signed)
Unable to raise left arm without pain x 3 days.  No known injury.  States pain around shoulder area.

## 2023-02-01 ENCOUNTER — Ambulatory Visit: Payer: 59 | Admitting: Family Medicine

## 2023-02-03 ENCOUNTER — Ambulatory Visit: Payer: 59 | Admitting: Nutrition

## 2023-02-03 ENCOUNTER — Telehealth: Payer: Self-pay | Admitting: Nutrition

## 2023-02-03 NOTE — Telephone Encounter (Signed)
Phone number busy. Unable to reach to r/s missed appt.

## 2023-03-14 ENCOUNTER — Ambulatory Visit (INDEPENDENT_AMBULATORY_CARE_PROVIDER_SITE_OTHER): Payer: 59 | Admitting: Family Medicine

## 2023-03-14 ENCOUNTER — Encounter: Payer: Self-pay | Admitting: Family Medicine

## 2023-03-14 ENCOUNTER — Other Ambulatory Visit: Payer: Self-pay | Admitting: Family Medicine

## 2023-03-14 VITALS — BP 148/100 | HR 87 | Temp 97.8°F | Ht 63.0 in | Wt 391.0 lb

## 2023-03-14 DIAGNOSIS — R030 Elevated blood-pressure reading, without diagnosis of hypertension: Secondary | ICD-10-CM

## 2023-03-14 DIAGNOSIS — J310 Chronic rhinitis: Secondary | ICD-10-CM

## 2023-03-14 DIAGNOSIS — Z1159 Encounter for screening for other viral diseases: Secondary | ICD-10-CM

## 2023-03-14 DIAGNOSIS — I10 Essential (primary) hypertension: Secondary | ICD-10-CM | POA: Insufficient documentation

## 2023-03-14 DIAGNOSIS — N939 Abnormal uterine and vaginal bleeding, unspecified: Secondary | ICD-10-CM | POA: Diagnosis not present

## 2023-03-14 DIAGNOSIS — F419 Anxiety disorder, unspecified: Secondary | ICD-10-CM | POA: Diagnosis not present

## 2023-03-14 DIAGNOSIS — T485X5A Adverse effect of other anti-common-cold drugs, initial encounter: Secondary | ICD-10-CM

## 2023-03-14 DIAGNOSIS — Z114 Encounter for screening for human immunodeficiency virus [HIV]: Secondary | ICD-10-CM

## 2023-03-14 DIAGNOSIS — E119 Type 2 diabetes mellitus without complications: Secondary | ICD-10-CM

## 2023-03-14 DIAGNOSIS — Z6841 Body Mass Index (BMI) 40.0 and over, adult: Secondary | ICD-10-CM

## 2023-03-14 MED ORDER — METFORMIN HCL 500 MG PO TABS: 500.0000 mg | ORAL_TABLET | Freq: Two times a day (BID) | ORAL | 1 refills | Status: DC

## 2023-03-14 MED ORDER — FLUTICASONE PROPIONATE 50 MCG/ACT NA SUSP: 2.0000 | Freq: Every day | NASAL | 6 refills | Status: AC

## 2023-03-14 NOTE — Assessment & Plan Note (Signed)
No known history of elevated BP readings. Encouraged to check BP outside of office and report values to me via MyChart. Will schedule 1 month follow-up to assess if BP remains elevated. Encouraged heart healthy diet and 150 minutes moderate intensity exercise weekly as well as low calorie diet for weight loss. Denies chest pain, dyspnea with exertion, palpitations, vision changes, recurrent headaches. Labs ordered.

## 2023-03-14 NOTE — Progress Notes (Signed)
New Patient Office Visit  Subjective    Patient ID: Caroline Yoder, female    DOB: 1991-07-03  Age: 32 y.o. MRN: [id number]  CC:  Chief Complaint  Patient presents with   Establish Care    Per pt have trouble breathing out due to nose being congested all the time.     HPI Caroline Yoder presents to establish care. Oriented to practice routines and expectations. PMH includes DM2, migraines, chronic sinus congestion and depression. She does see neurology for migraines. Concerns include uncontrolled chronic congestion for 3 years. Has tried Flonase, Afrin, and Mucinex. Her nasal congestion occurs constantly. She denies sinus pressure and headaches, fevers, bleeding, and has occasional loss of taste and smell. This congestion occurs for weeks and a time and she reports it never goes away for an extended period of time. She also reports abnormal uterine bleeding. She reports years of no menstrual cycles. She did have a cycle last month. Her cycles last 10-15 days and start heavy then get light. She does see OBGYN for care.  Cervical CA screening: she reports she is due but had one 2 years ago, she goes to [employer] Tobacco: non-smoker STI: declines Vaccines: Tdap due, declines today  DIABETES Hypoglycemic episodes:no Polydipsia/polyuria: no Visual disturbance: no Chest pain: no Paresthesias: no Glucose Monitoring: yes  Accucheck frequency: BID  Fasting glucose: <120  Post prandial:   Evening:  Before meals: Taking Insulin?: no  Long acting insulin:  Short acting insulin: Blood Pressure Monitoring: not checking Retinal Examination: Not up to Date Foot Exam: Not up to Date Diabetic Education:  reinforced today Pneumovax: unknown Influenza: unknown Aspirin: no     Outpatient Encounter Medications as of 03/14/2023  Medication Sig   acetaminophen (TYLENOL) 325 MG tablet Take 2 tablets (650 mg total) by mouth every 6 (six) hours as needed for mild pain, fever or  headache (fever >/= 101).   ALPRAZolam (XANAX) 1 MG tablet Take 1 mg by mouth daily.   escitalopram (LEXAPRO) 10 MG tablet Take 10 mg by mouth.   escitalopram (LEXAPRO) 5 MG tablet Take 5 mg by mouth daily.   fluticasone (FLONASE) 50 MCG/ACT nasal spray Place 2 sprays into both nostrils daily.   furosemide (LASIX) 20 MG tablet Take 20 mg by mouth.   furosemide (LASIX) 40 MG tablet Take 20-40 mg by mouth daily as needed.   potassium chloride SA (KLOR-CON M) 20 MEQ tablet Take 20 mEq by mouth daily.   traZODone (DESYREL) 150 MG tablet Take 150 mg by mouth at bedtime.   albuterol (VENTOLIN HFA) 108 (90 Base) MCG/ACT inhaler Inhale 2 puffs into the lungs every 6 (six) hours. (Patient not taking: Reported on 03/14/2023)   ascorbic acid (VITAMIN C) 500 MG tablet Take 1 tablet (500 mg total) by mouth daily. (Patient not taking: Reported on 03/14/2023)   ibuprofen (ADVIL) 800 MG tablet Take 1 tablet (800 mg total) by mouth 3 (three) times daily. (Patient not taking: Reported on 03/14/2023)   metFORMIN (GLUCOPHAGE) 500 MG tablet Take 1 tablet (500 mg total) by mouth 2 (two) times daily with a meal. For  High Blood sugar and Metabolic Syndrome X   methocarbamol (ROBAXIN) 500 MG tablet Take 1 tablet (500 mg total) by mouth 2 (two) times daily. (Patient not taking: Reported on 03/14/2023)   Multiple Vitamin (MULTIVITAMIN WITH MINERALS) TABS tablet Take 1 tablet by mouth daily. (Patient not taking: Reported on 03/14/2023)   pantoprazole (PROTONIX) 40 MG tablet Take 1  tablet (40 mg total) by mouth daily. (Patient not taking: Reported on 03/14/2023)   zinc sulfate 220 (50 Zn) MG capsule Take 1 capsule (220 mg total) by mouth daily. (Patient not taking: Reported on 03/14/2023)   [DISCONTINUED] metFORMIN (GLUCOPHAGE) 500 MG tablet Take 1 tablet (500 mg total) by mouth 2 (two) times daily with a meal. For  High Blood sugar and Metabolic Syndrome X   No facility-administered encounter medications on file as of 03/14/2023.     Past Medical History:  Diagnosis Date   Abnormal uterine bleeding (AUB) 05/23/2015   Contraceptive management 05/23/2015   Depression    Diabetes mellitus without complication (HCC)    Hx of chlamydia infection    Menorrhagia 02/11/2014   Obesity    Trichimoniasis 05/23/2015   Vaginal discharge 05/23/2015    History reviewed. No pertinent surgical history.  Family History  Problem Relation Age of Onset   Diabetes Mother    Thyroid disease Mother    Thyroid disease Brother    Diabetes Brother    Thyroid disease Brother    Seizures Brother    Diabetes Brother    Hypertension Maternal Grandmother    Diabetes Other     Social History   Socioeconomic History   Marital status: Single    Spouse name: Not on file   Number of children: Not on file   Years of education: Not on file   Highest education level: Not on file  Occupational History   Not on file  Tobacco Use   Smoking status: Never   Smokeless tobacco: Never  Vaping Use   Vaping status: Never Used  Substance and Sexual Activity   Alcohol use: No   Drug use: Never   Sexual activity: Not Currently  Other Topics Concern   Not on file  Social History Narrative   Not on file   Social Determinants of Health   Financial Resource Strain: Not on file  Food Insecurity: Not on file  Transportation Needs: Not on file  Physical Activity: Not on file  Stress: Not on file  Social Connections: Not on file  Intimate Partner Violence: Not on file    Review of Systems  Constitutional: Negative.   HENT:  Positive for congestion. Negative for nosebleeds and sinus pain.   Eyes: Negative.   Respiratory: Negative.    Cardiovascular:  Positive for leg swelling.  Gastrointestinal: Negative.   Genitourinary: Negative.   Musculoskeletal: Negative.   Skin: Negative.   Neurological: Negative.   Endo/Heme/Allergies: Negative.   Psychiatric/Behavioral:  The patient is nervous/anxious.   All other systems reviewed and  are negative.       Objective    BP (!) 148/100   Pulse 87   Temp 97.8 F (36.6 C) (Oral)   Ht 5\' 3"  (1.6 m)   Wt (!) 391 lb (177.4 kg)   LMP 03/10/2023 (Approximate)   SpO2 97%   BMI 69.26 kg/m    Physical Exam Vitals and nursing note reviewed.  Constitutional:      Appearance: Normal appearance. She is obese.  HENT:     Head: Normocephalic and atraumatic.     Nose: Congestion present.  Cardiovascular:     Rate and Rhythm: Normal rate and regular rhythm.     Pulses: Normal pulses.     Heart sounds: Normal heart sounds.  Pulmonary:     Effort: Pulmonary effort is normal.     Breath sounds: Normal breath sounds.  Skin:    General:  Skin is warm and dry.  Neurological:     General: No focal deficit present.     Mental Status: She is alert and oriented to person, place, and time. Mental status is at baseline.  Psychiatric:        Mood and Affect: Mood normal.        Behavior: Behavior normal.        Thought Content: Thought content normal.        Judgment: Judgment normal.         Assessment & Plan:   Problem List Items Addressed This Visit     Abnormal uterine bleeding (AUB)    She does see an OBGYN. Reports she is due for PAP. Encouraged her to call to schedule this appointment and discuss her AUB.      Relevant Orders   CBC with Differential/Platelet   Type 2 diabetes mellitus without complication, without long-term current use of insulin (HCC)    Will obtain A1c, uACR, B12, foot exam done today. Will schedule eye exams. Continue Metformin 500mg  BID. Encourage low calorie carb conscious diet and 150 minutes moderate intensity exercise weekly.      Relevant Medications   metFORMIN (GLUCOPHAGE) 500 MG tablet   Other Relevant Orders   CBC with Differential/Platelet   COMPLETE METABOLIC PANEL WITH GFR   Lipid panel   Hemoglobin A1c   Vitamin B12   Anxiety - Primary    Well controlled on Lexapro, Xanax, and Trazodone. Denies SI/HI.     03/14/2023     9:48 AM  GAD 7 : Generalized Anxiety Score  Nervous, Anxious, on Edge 1  Control/stop worrying 0  Worry too much - different things 2  Trouble relaxing 2  Restless 1  Easily annoyed or irritable 1  Afraid - awful might happen 0  Total GAD 7 Score 7  Anxiety Difficulty Not difficult at all          Relevant Medications   ALPRAZolam (XANAX) 1 MG tablet   traZODone (DESYREL) 150 MG tablet   escitalopram (LEXAPRO) 10 MG tablet   Morbid obesity with BMI of 60.0-69.9, adult (HCC)    Encourage carb conscious low calorie diet and 150 minutes moderate intensity exercise weekly. She is working on weight loss by walking 1 hour each day.       Relevant Medications   metFORMIN (GLUCOPHAGE) 500 MG tablet   Other Relevant Orders   CBC with Differential/Platelet   COMPLETE METABOLIC PANEL WITH GFR   Lipid panel   TSH   Hemoglobin A1c   Elevated blood pressure reading in office without diagnosis of hypertension    No known history of elevated BP readings. Encouraged to check BP outside of office and report values to me via MyChart. Will schedule 1 month follow-up to assess if BP remains elevated. Encouraged heart healthy diet and 150 minutes moderate intensity exercise weekly as well as low calorie diet for weight loss. Denies chest pain, dyspnea with exertion, palpitations, vision changes, recurrent headaches. Labs ordered.      Rhinitis medicamentosa    Patient has been using afrin regularly. Recommend discontinuing and start Flonase. Also recommend saline flush, neti pot, and cold air humidification. Will place ENT referral given duration of symptoms for further evaluation.      Relevant Orders   Ambulatory referral to ENT   Other Visit Diagnoses     Screening for HIV (human immunodeficiency virus)       Relevant Orders   HIV Antibody (  routine testing w rflx)   Need for hepatitis C screening test       Relevant Orders   Hepatitis C antibody       Return in about 4 weeks  (around 04/11/2023) for elevated BP.   Park Meo, FNP

## 2023-03-14 NOTE — Assessment & Plan Note (Addendum)
Patient has been using afrin regularly. Recommend discontinuing and start Flonase. Also recommend saline flush, neti pot, and cold air humidification. Will place ENT referral given duration of symptoms for further evaluation.

## 2023-03-14 NOTE — Assessment & Plan Note (Signed)
Encourage carb conscious low calorie diet and 150 minutes moderate intensity exercise weekly. She is working on weight loss by walking 1 hour each day.

## 2023-03-14 NOTE — Assessment & Plan Note (Signed)
Well controlled on Lexapro, Xanax, and Trazodone. Denies SI/HI.     03/14/2023    9:48 AM  GAD 7 : Generalized Anxiety Score  Nervous, Anxious, on Edge 1  Control/stop worrying 0  Worry too much - different things 2  Trouble relaxing 2  Restless 1  Easily annoyed or irritable 1  Afraid - awful might happen 0  Total GAD 7 Score 7  Anxiety Difficulty Not difficult at all

## 2023-03-14 NOTE — Patient Instructions (Signed)
 It was great to meet you today and I'm excited to have you join the [facility] practice. I hope you had a positive experience today! If you feel so inclined, please feel free to recommend our practice to friends and family. Mila Merry, FNP-C

## 2023-03-14 NOTE — Assessment & Plan Note (Signed)
She does see an OBGYN. Reports she is due for PAP. Encouraged her to call to schedule this appointment and discuss her AUB.

## 2023-03-14 NOTE — Assessment & Plan Note (Signed)
Will obtain A1c, uACR, B12, foot exam done today. Will schedule eye exams. Continue Metformin 500mg  BID. Encourage low calorie carb conscious diet and 150 minutes moderate intensity exercise weekly.

## 2023-03-15 NOTE — Progress Notes (Deleted)
[facility]  PATIENT: Caroline Yoder DOB: Jan 12, 1991  REFERRING DOCTOR OR PCP: Caroline Giovanni, MD SOURCE: Patient, note from primary care, imaging and lab reports, cervical spine images personally reviewed.  _________________________________   HISTORICAL  CHIEF COMPLAINT:  No chief complaint on file.   HISTORY OF PRESENT ILLNESS:  I had the pleasure of seeing your patient, Caroline Yoder, at [facility] for neurologic consultation regarding her headaches.   Imaging: Plain films of the cervical spine 05/31/2021 was normal.  REVIEW OF SYSTEMS: Constitutional: No fevers, chills, sweats, or change in appetite Eyes: No visual changes, double vision, eye pain Ear, nose and throat: No hearing loss, ear pain, nasal congestion, sore throat Cardiovascular: No chest pain, palpitations Respiratory:  No shortness of breath at rest or with exertion.   No wheezes GastrointestinaI: No nausea, vomiting, diarrhea, abdominal pain, fecal incontinence Genitourinary:  No dysuria, urinary retention or frequency.  No nocturia. Musculoskeletal:  No neck pain, back pain Integumentary: No rash, pruritus, skin lesions Neurological: as above Psychiatric: No depression at this time.  No anxiety Endocrine: No palpitations, diaphoresis, change in appetite, change in weigh or increased thirst Hematologic/Lymphatic:  No anemia, purpura, petechiae. Allergic/Immunologic: No itchy/runny eyes, nasal congestion, recent allergic reactions, rashes  ALLERGIES: Allergies  Allergen Reactions   Strawberry Flavor Anaphylaxis    HOME MEDICATIONS:  Current Outpatient Medications:    acetaminophen (TYLENOL) 325 MG tablet, Take 2 tablets (650 mg total) by mouth every 6 (six) hours as needed for mild pain, fever or headache (fever >/= 101)., Disp: 12 tablet, Rfl: 0   albuterol (VENTOLIN HFA) 108 (90 Base) MCG/ACT inhaler, Inhale 2 puffs into the lungs every 6 (six) hours.  (Patient not taking: Reported on 03/14/2023), Disp: 18 g, Rfl: 0   ALPRAZolam (XANAX) 1 MG tablet, Take 1 mg by mouth daily., Disp: , Rfl:    ascorbic acid (VITAMIN C) 500 MG tablet, Take 1 tablet (500 mg total) by mouth daily. (Patient not taking: Reported on 03/14/2023), Disp: 30 tablet, Rfl: 2   escitalopram (LEXAPRO) 10 MG tablet, Take 10 mg by mouth., Disp: , Rfl:    escitalopram (LEXAPRO) 5 MG tablet, Take 5 mg by mouth daily., Disp: , Rfl:    fluticasone (FLONASE) 50 MCG/ACT nasal spray, Place 2 sprays into both nostrils daily., Disp: 16 g, Rfl: 6   furosemide (LASIX) 20 MG tablet, Take 20 mg by mouth., Disp: , Rfl:    furosemide (LASIX) 40 MG tablet, Take 20-40 mg by mouth daily as needed., Disp: , Rfl:    ibuprofen (ADVIL) 800 MG tablet, Take 1 tablet (800 mg total) by mouth 3 (three) times daily. (Patient not taking: Reported on 03/14/2023), Disp: 21 tablet, Rfl: 0   metFORMIN (GLUCOPHAGE) 500 MG tablet, Take 1 tablet (500 mg total) by mouth 2 (two) times daily with a meal. For  High Blood sugar and Metabolic Syndrome X, Disp: 60 tablet, Rfl: 1   methocarbamol (ROBAXIN) 500 MG tablet, Take 1 tablet (500 mg total) by mouth 2 (two) times daily. (Patient not taking: Reported on 03/14/2023), Disp: 20 tablet, Rfl: 0   Multiple Vitamin (MULTIVITAMIN WITH MINERALS) TABS tablet, Take 1 tablet by mouth daily. (Patient not taking: Reported on 03/14/2023), Disp: 30 tablet, Rfl: 1   pantoprazole (PROTONIX) 40 MG tablet, Take 1 tablet (40 mg total) by mouth daily. (Patient not taking: Reported on 03/14/2023), Disp: 30 tablet, Rfl: 0   potassium chloride SA (KLOR-CON M) 20 MEQ tablet, Take 20 mEq by  mouth daily., Disp: , Rfl:    traZODone (DESYREL) 150 MG tablet, Take 150 mg by mouth at bedtime., Disp: , Rfl:    zinc sulfate 220 (50 Zn) MG capsule, Take 1 capsule (220 mg total) by mouth daily. (Patient not taking: Reported on 03/14/2023), Disp: 30 capsule, Rfl: 1  PAST MEDICAL HISTORY: Past Medical History:   Diagnosis Date   Abnormal uterine bleeding (AUB) 05/23/2015   Contraceptive management 05/23/2015   Depression    Diabetes mellitus without complication (HCC)    Hx of chlamydia infection    Menorrhagia 02/11/2014   Obesity    Trichimoniasis 05/23/2015   Vaginal discharge 05/23/2015    PAST SURGICAL HISTORY: No past surgical history on file.  FAMILY HISTORY: Family History  Problem Relation Age of Onset   Diabetes Mother    Thyroid disease Mother    Thyroid disease Brother    Diabetes Brother    Thyroid disease Brother    Seizures Brother    Diabetes Brother    Hypertension Maternal Grandmother    Diabetes Other     SOCIAL HISTORY: Social History   Socioeconomic History   Marital status: Single    Spouse name: Not on file   Number of children: Not on file   Years of education: Not on file   Highest education level: Not on file  Occupational History   Not on file  Tobacco Use   Smoking status: Never   Smokeless tobacco: Never  Vaping Use   Vaping status: Never Used  Substance and Sexual Activity   Alcohol use: No   Drug use: Never   Sexual activity: Not Currently  Other Topics Concern   Not on file  Social History Narrative   Not on file   Social Determinants of Health   Financial Resource Strain: Not on file  Food Insecurity: Not on file  Transportation Needs: Not on file  Physical Activity: Not on file  Stress: Not on file  Social Connections: Not on file  Intimate Partner Violence: Not on file       PHYSICAL EXAM  There were no vitals filed for this visit.  There is no height or weight on file to calculate BMI.   General: The patient is well-developed and well-nourished and in no acute distress  HEENT:  Head is [place]/AT.  Sclera are anicteric.  Funduscopic exam shows normal optic discs and retinal vessels.  Neck: No carotid bruits are noted.  The neck is nontender.  Cardiovascular: The heart has a regular rate and rhythm with a normal  S1 and S2. There were no murmurs, gallops or rubs.    Skin: Extremities are without rash or  edema.  Musculoskeletal:  Back is nontender  Neurologic Exam  Mental status: The patient is alert and oriented x 3 at the time of the examination. The patient has apparent normal recent and remote memory, with an apparently normal attention span and concentration ability.   Speech is normal.  Cranial nerves: Extraocular movements are full. Pupils are equal, round, and reactive to light and accomodation.  Visual fields are full.  Facial symmetry is present. There is good facial sensation to soft touch bilaterally.Facial strength is normal.  Trapezius and sternocleidomastoid strength is normal. No dysarthria is noted.  The tongue is midline, and the patient has symmetric elevation of the soft palate. No obvious hearing deficits are noted.  Motor:  Muscle bulk is normal.   Tone is normal. Strength is  5 / 5 in  all 4 extremities.   Sensory: Sensory testing is intact to pinprick, soft touch and vibration sensation in all 4 extremities.  Coordination: Cerebellar testing reveals good finger-nose-finger and heel-to-shin bilaterally.  Gait and station: Station is normal.   Gait is normal. Tandem gait is normal. Romberg is negative.   Reflexes: Deep tendon reflexes are symmetric and normal bilaterally.   Plantar responses are flexor.    DIAGNOSTIC DATA (LABS, IMAGING, TESTING) - I reviewed patient records, labs, notes, testing and imaging myself where available.  Lab Results  Component Value Date   WBC 12.4 (H) 01/06/2021   HGB 10.8 (L) 01/06/2021   HCT 36.7 01/06/2021   MCV 81.2 01/06/2021   PLT 308 01/06/2021      Component Value Date/Time   NA 137 01/06/2021 0414   K 3.7 01/06/2021 0414   CL 101 01/06/2021 0414   CO2 25 01/06/2021 0414   GLUCOSE 264 (H) 01/06/2021 0414   BUN 7 01/06/2021 0414   CREATININE 0.80 01/06/2021 0414   CALCIUM 8.5 (L) 01/06/2021 0414   PROT 7.6 01/06/2021 0414    ALBUMIN 3.3 (L) 01/06/2021 0414   AST 19 01/06/2021 0414   ALT 9 01/06/2021 0414   ALKPHOS 91 01/06/2021 0414   BILITOT 0.3 01/06/2021 0414   GFRNONAA >60 01/06/2021 0414   Lab Results  Component Value Date   TRIG 84 01/05/2021   Lab Results  Component Value Date   HGBA1C 6.8 (H) 01/06/2021   No results found for: "VITAMINB12" No results found for: "TSH"     ASSESSMENT AND PLAN  ***    A. Epimenio Foot, MD, [facility] 03/15/2023, 9:44 PM Certified in Neurology, Clinical Neurophysiology, Sleep Medicine and Neuroimaging  [facility] [address] [place], [place] [address] [phone]

## 2023-03-16 ENCOUNTER — Encounter: Payer: Self-pay | Admitting: Neurology

## 2023-03-16 ENCOUNTER — Ambulatory Visit: Payer: 59 | Admitting: Neurology

## 2023-04-07 ENCOUNTER — Ambulatory Visit (INDEPENDENT_AMBULATORY_CARE_PROVIDER_SITE_OTHER): Payer: 59

## 2023-04-07 VITALS — Ht 63.0 in | Wt 391.0 lb

## 2023-04-07 DIAGNOSIS — Z0000 Encounter for general adult medical examination without abnormal findings: Secondary | ICD-10-CM | POA: Diagnosis not present

## 2023-04-07 NOTE — Progress Notes (Signed)
Subjective:   Caroline Yoder is a 32 y.o. female who presents for an Initial Medicare Annual Wellness Visit.  Visit Complete: Virtual  I connected with  Caroline Yoder on 04/07/23 by a audio enabled telemedicine application and verified that I am speaking with the correct person using two identifiers.  Patient Location: Home  Provider Location: Home Office  I discussed the limitations of evaluation and management by telemedicine. The patient expressed understanding and agreed to proceed.  Vital Signs: Unable to obtain new vitals due to this being a telehealth visit.  Review of Systems     Cardiac Risk Factors include: obesity (BMI >30kg/m2);hypertension;diabetes mellitus;sedentary lifestyle     Objective:    Today's Vitals   04/07/23 1132  Weight: (!) 391 lb (177.4 kg)  Height: 5\' 3"  (1.6 m)   Body mass index is 69.26 kg/m.     04/07/2023    3:39 PM 06/24/2021    9:58 AM 05/31/2021   12:29 PM 01/06/2021    2:00 AM 12/10/2020    9:29 AM 06/03/2017   10:21 AM 10/19/2016    8:01 AM  Advanced Directives  Does Patient Have a Medical Advance Directive? No No No No No No No  Would patient like information on creating a medical advance directive? Yes (MAU/Ambulatory/Procedural Areas - Information given) No - Patient declined  No - Patient declined   No - Patient declined    Current Medications (verified) Outpatient Encounter Medications as of 04/07/2023  Medication Sig   acetaminophen (TYLENOL) 325 MG tablet Take 2 tablets (650 mg total) by mouth every 6 (six) hours as needed for mild pain, fever or headache (fever >/= 101).   ALPRAZolam (XANAX) 1 MG tablet Take 1 mg by mouth daily.   escitalopram (LEXAPRO) 10 MG tablet Take 10 mg by mouth.   furosemide (LASIX) 40 MG tablet Take 20-40 mg by mouth daily as needed.   metFORMIN (GLUCOPHAGE) 500 MG tablet Take 1 tablet (500 mg total) by mouth 2 (two) times daily with a meal. For  High Blood sugar and Metabolic Syndrome X    potassium chloride SA (KLOR-CON M) 20 MEQ tablet Take 20 mEq by mouth daily.   traZODone (DESYREL) 150 MG tablet Take 150 mg by mouth at bedtime.   albuterol (VENTOLIN HFA) 108 (90 Base) MCG/ACT inhaler Inhale 2 puffs into the lungs every 6 (six) hours. (Patient not taking: Reported on 03/14/2023)   fluticasone (FLONASE) 50 MCG/ACT nasal spray Place 2 sprays into both nostrils daily. (Patient not taking: Reported on 04/07/2023)   [DISCONTINUED] ascorbic acid (VITAMIN C) 500 MG tablet Take 1 tablet (500 mg total) by mouth daily. (Patient not taking: Reported on 03/14/2023)   [DISCONTINUED] escitalopram (LEXAPRO) 5 MG tablet Take 5 mg by mouth daily.   [DISCONTINUED] furosemide (LASIX) 20 MG tablet Take 20 mg by mouth.   [DISCONTINUED] ibuprofen (ADVIL) 800 MG tablet Take 1 tablet (800 mg total) by mouth 3 (three) times daily. (Patient not taking: Reported on 03/14/2023)   [DISCONTINUED] methocarbamol (ROBAXIN) 500 MG tablet Take 1 tablet (500 mg total) by mouth 2 (two) times daily. (Patient not taking: Reported on 03/14/2023)   [DISCONTINUED] Multiple Vitamin (MULTIVITAMIN WITH MINERALS) TABS tablet Take 1 tablet by mouth daily. (Patient not taking: Reported on 03/14/2023)   [DISCONTINUED] pantoprazole (PROTONIX) 40 MG tablet Take 1 tablet (40 mg total) by mouth daily. (Patient not taking: Reported on 03/14/2023)   [DISCONTINUED] zinc sulfate 220 (50 Zn) MG capsule Take 1 capsule (220  mg total) by mouth daily. (Patient not taking: Reported on 03/14/2023)   No facility-administered encounter medications on file as of 04/07/2023.    Allergies (verified) Strawberry flavor   History: Past Medical History:  Diagnosis Date   Abnormal uterine bleeding (AUB) 05/23/2015   Contraceptive management 05/23/2015   Depression    Diabetes mellitus without complication (HCC)    Hx of chlamydia infection    Menorrhagia 02/11/2014   Obesity    Trichimoniasis 05/23/2015   Vaginal discharge 05/23/2015   History  reviewed. No pertinent surgical history. Family History  Problem Relation Age of Onset   Diabetes Mother    Thyroid disease Mother    Thyroid disease Brother    Diabetes Brother    Thyroid disease Brother    Seizures Brother    Diabetes Brother    Hypertension Maternal Grandmother    Diabetes Other    Social History   Socioeconomic History   Marital status: Single    Spouse name: Not on file   Number of children: Not on file   Years of education: Not on file   Highest education level: Not on file  Occupational History   Not on file  Tobacco Use   Smoking status: Never   Smokeless tobacco: Never  Vaping Use   Vaping status: Never Used  Substance and Sexual Activity   Alcohol use: No   Drug use: Never   Sexual activity: Not Currently  Other Topics Concern   Not on file  Social History Narrative   Not on file   Social Determinants of Health   Financial Resource Strain: Low Risk  (04/07/2023)   Overall Financial Resource Strain (CARDIA)    Difficulty of Paying Living Expenses: Not hard at all  Food Insecurity: No Food Insecurity (04/07/2023)   Hunger Vital Sign    Worried About Running Out of Food in the Last Year: Never true    Ran Out of Food in the Last Year: Never true  Transportation Needs: No Transportation Needs (04/07/2023)   PRAPARE - Administrator, Civil Service (Medical): No    Lack of Transportation (Non-Medical): No  Physical Activity: Insufficiently Active (04/07/2023)   Exercise Vital Sign    Days of Exercise per Week: 3 days    Minutes of Exercise per Session: 30 min  Stress: No Stress Concern Present (04/07/2023)   [employer] of Occupational Health - Occupational Stress Questionnaire    Feeling of Stress : Not at all  Social Connections: Moderately Isolated (04/07/2023)   Social Connection and Isolation Panel [NHANES]    Frequency of Communication with Friends and Family: More than three times a week    Frequency of Social Gatherings  with Friends and Family: Three times a week    Attends Religious Services: More than 4 times per year    Active Member of Clubs or Organizations: No    Attends Banker Meetings: Never    Marital Status: Never married    Tobacco Counseling Counseling given: Not Answered   Clinical Intake:  Pre-visit preparation completed: Yes  Pain : No/denies pain     Diabetes: Yes CBG done?: No Did pt. bring in CBG monitor from home?: No  How often do you need to have someone help you when you read instructions, pamphlets, or other written materials from your doctor or pharmacy?: 1 - Never  Interpreter Needed?: No  Information entered by :: Kandis Fantasia LPN   Activities of Daily Living  04/07/2023    3:38 PM  In your present state of health, do you have any difficulty performing the following activities:  Hearing? 0  Vision? 0  Difficulty concentrating or making decisions? 0  Walking or climbing stairs? 0  Dressing or bathing? 0  Doing errands, shopping? 0  Preparing Food and eating ? N  Using the Toilet? N  In the past six months, have you accidently leaked urine? N  Do you have problems with loss of bowel control? N  Managing your Medications? N  Managing your Finances? N  Housekeeping or managing your Housekeeping? N    Patient Care Team: Park Meo, FNP as PCP - General (Family Medicine)  Indicate any recent Medical Services you may have received from other than [facility] providers in the past year (date may be approximate).     Assessment:   This is a routine wellness examination for Naturi.  Hearing/Vision screen Hearing Screening - Comments:: Denies hearing difficulties   Vision Screening - Comments:: No vision problems; will schedule routine eye exam soon    Dietary issues and exercise activities discussed:     Goals Addressed             This Visit's Progress    Increase physical activity        Depression Screen    04/07/2023     3:37 PM 03/14/2023    9:47 AM  PHQ 2/9 Scores  PHQ - 2 Score 1 1  PHQ- 9 Score 8 9    Fall Risk    04/07/2023    3:38 PM 09/03/2019    9:53 AM  Fall Risk   Falls in the past year? 0 0  Number falls in past yr: 0   Injury with Fall? 0   Risk for fall due to : No Fall Risks   Follow up Falls prevention discussed;Education provided;Falls evaluation completed     MEDICARE RISK AT HOME:  Medicare Risk at Home - 04/07/23 1538     Any stairs in or around the home? No    If so, are there any without handrails? No    Home free of loose throw rugs in walkways, pet beds, electrical cords, etc? Yes    Adequate lighting in your home to reduce risk of falls? Yes    Life alert? No    Use of a cane, walker or w/c? No    Grab bars in the bathroom? No    Shower chair or bench in shower? No    Elevated toilet seat or a handicapped toilet? No             TIMED UP AND GO:  Was the test performed? No    Cognitive Function:        04/07/2023    3:39 PM  6CIT Screen  What Year? 0 points  What month? 0 points  What time? 0 points  Count back from 20 0 points  Months in reverse 0 points  Repeat phrase 0 points  Total Score 0 points    Immunizations  There is no immunization history on file for this patient.  TDAP status: Due, Education has been provided regarding the importance of this vaccine. Advised may receive this vaccine at local pharmacy or Health Dept. Aware to provide a copy of the vaccination record if obtained from local pharmacy or Health Dept. Verbalized acceptance and understanding.  Flu Vaccine status: Due, Education has been provided regarding the importance of this vaccine.  Advised may receive this vaccine at local pharmacy or Health Dept. Aware to provide a copy of the vaccination record if obtained from local pharmacy or Health Dept. Verbalized acceptance and understanding.  Pneumococcal vaccine status: Up to date  Covid-19 vaccine status: Information provided on  how to obtain vaccines.   Qualifies for Shingles Vaccine? No     Screening Tests Health Maintenance  Topic Date Due   OPHTHALMOLOGY EXAM  Never done   Diabetic kidney evaluation - Urine ACR  Never done   Hepatitis C Screening  Never done   PAP SMEAR-Modifier  Never done   HEMOGLOBIN A1C  07/09/2021   Diabetic kidney evaluation - eGFR measurement  01/06/2022   COVID-19 Vaccine (1 - 2023-24 season) 07/21/2023 (Originally 04/30/2022)   INFLUENZA VACCINE  11/28/2023 (Originally 03/31/2023)   FOOT EXAM  03/13/2024   Medicare Annual Wellness (AWV)  04/06/2024   HIV Screening  Completed   HPV VACCINES  Aged Out   DTaP/Tdap/Td  Discontinued    Health Maintenance  Health Maintenance Due  Topic Date Due   OPHTHALMOLOGY EXAM  Never done   Diabetic kidney evaluation - Urine ACR  Never done   Hepatitis C Screening  Never done   PAP SMEAR-Modifier  Never done   HEMOGLOBIN A1C  07/09/2021   Diabetic kidney evaluation - eGFR measurement  01/06/2022    Lung Cancer Screening: (Low Dose CT Chest recommended if Age 69-80 years, 20 pack-year currently smoking OR have quit w/in 15years.) does not qualify.   Lung Cancer Screening Referral: n/a  Additional Screening:  Hepatitis C Screening: does qualify;   Vision Screening: Recommended annual ophthalmology exams for early detection of glaucoma and other disorders of the eye. Is the patient up to date with their annual eye exam?  No  Who is the provider or what is the name of the office in which the patient attends annual eye exams? none If pt is not established with a provider, would they like to be referred to a provider to establish care? No .   Dental Screening: Recommended annual dental exams for proper oral hygiene  Diabetic Foot Exam: Completed 03/14/23  Community Resource Referral / Chronic Care Management: CRR required this visit?  No   CCM required this visit?  No     Plan:     I have personally reviewed and noted the  following in the patient's chart:   Medical and social history Use of alcohol, tobacco or illicit drugs  Current medications and supplements including opioid prescriptions. Patient is not currently taking opioid prescriptions. Functional ability and status Nutritional status Physical activity Advanced directives List of other physicians Hospitalizations, surgeries, and ER visits in previous 12 months Vitals Screenings to include cognitive, depression, and falls Referrals and appointments  In addition, I have reviewed and discussed with patient certain preventive protocols, quality metrics, and best practice recommendations. A written personalized care plan for preventive services as well as general preventive health recommendations were provided to patient.     Kandis Fantasia [place], [place]   09/07/1476   After Visit Summary: (MyChart) Due to this being a telephonic visit, the after visit summary with patients personalized plan was offered to patient via MyChart   Nurse Notes: Patient would like to discuss Alprazolam rx and allergy medication at upcoming appointment.

## 2023-04-07 NOTE — Patient Instructions (Signed)
Caroline Yoder , Thank you for taking time to come for your Medicare Wellness Visit. I appreciate your ongoing commitment to your health goals. Please review the following plan we discussed and let me know if I can assist you in the future.   Referrals/Orders/Follow-Ups/Clinician Recommendations: Aim for 30 minutes of exercise or brisk walking, 6-8 glasses of water, and 5 servings of fruits and vegetables each day.  This is a list of the screening recommended for you and due dates:  Health Maintenance  Topic Date Due   Eye exam for diabetics  Never done   Yearly kidney health urinalysis for diabetes  Never done   Hepatitis C Screening  Never done   Pap Smear  Never done   Hemoglobin A1C  07/09/2021   Yearly kidney function blood test for diabetes  01/06/2022   COVID-19 Vaccine (1 - 2023-24 season) 07/21/2023*   Flu Shot  11/28/2023*   Complete foot exam   03/13/2024   Medicare Annual Wellness Visit  04/06/2024   HIV Screening  Completed   HPV Vaccine  Aged Out   DTaP/Tdap/Td vaccine  Discontinued  *Topic was postponed. The date shown is not the original due date.    Advanced directives: (ACP Link)Information on Advanced Care Planning can be found at [facility] Advance Health Care Directives Advance Health Care Directives ([email or web address]   Next Medicare Annual Wellness Visit scheduled for next year: Yes  Preventive Care 32-40 Years Old, Female Preventive care refers to lifestyle choices and visits with your health care provider that can promote health and wellness. Preventive care visits are also called wellness exams. What can I expect for my preventive care visit? Counseling During your preventive care visit, your health care provider may ask about your: Medical history, including: Past medical problems. Family medical history. Pregnancy history. Current health, including: Menstrual cycle. Method of birth control. Emotional well-being. Home life and  relationship well-being. Sexual activity and sexual health. Lifestyle, including: Alcohol, nicotine or tobacco, and drug use. Access to firearms. Diet, exercise, and sleep habits. Work and work Astronomer. Sunscreen use. Safety issues such as seatbelt and bike helmet use. Physical exam Your health care provider may check your: Height and weight. These may be used to calculate your BMI (body mass index). BMI is a measurement that tells if you are at a healthy weight. Waist circumference. This measures the distance around your waistline. This measurement also tells if you are at a healthy weight and may help predict your risk of certain diseases, such as type 2 diabetes and high blood pressure. Heart rate and blood pressure. Body temperature. Skin for abnormal spots. What immunizations do I need? Vaccines are usually given at various ages, according to a schedule. Your health care provider will recommend vaccines for you based on your age, medical history, and lifestyle or other factors, such as travel or where you work. What tests do I need? Screening Your health care provider may recommend screening tests for certain conditions. This may include: Pelvic exam and Pap test. Lipid and cholesterol levels. Diabetes screening. This is done by checking your blood sugar (glucose) after you have not eaten for a while (fasting). Hepatitis B test. Hepatitis C test. HIV (human immunodeficiency virus) test. STI (sexually transmitted infection) testing, if you are at risk. BRCA-related cancer screening. This may be done if you have a family history of breast, ovarian, tubal, or peritoneal cancers. Talk with your health care provider about your test results, treatment options, and if  necessary, the need for more tests. Follow these instructions at home: Eating and drinking  Eat a healthy diet that includes fresh fruits and vegetables, whole grains, lean protein, and low-fat dairy products. Take  vitamin and mineral supplements as recommended by your health care provider. Do not drink alcohol if: Your health care provider tells you not to drink. You are pregnant, may be pregnant, or are planning to become pregnant. If you drink alcohol: Limit how much you have to 0-1 drink a day. Know how much alcohol is in your drink. In the [place] one drink equals one 12 oz bottle of beer (355 mL), one 5 oz glass of wine (148 mL), or one 1 oz glass of hard liquor (44 mL). Lifestyle Brush your teeth every morning and night with fluoride toothpaste. Floss one time each day. Exercise for at least 30 minutes 5 or more days each week. Do not use any products that contain nicotine or tobacco. These products include cigarettes, chewing tobacco, and vaping devices, such as e-cigarettes. If you need help quitting, ask your health care provider. Do not use drugs. If you are sexually active, practice safe sex. Use a condom or other form of protection to prevent STIs. If you do not wish to become pregnant, use a form of birth control. If you plan to become pregnant, see your health care provider for a prepregnancy visit. Find healthy ways to manage stress, such as: Meditation, yoga, or listening to music. Journaling. Talking to a trusted person. Spending time with friends and family. Minimize exposure to UV radiation to reduce your risk of skin cancer. Safety Always wear your seat belt while driving or riding in a vehicle. Do not drive: If you have been drinking alcohol. Do not ride with someone who has been drinking. If you have been using any mind-altering substances or drugs. While texting. When you are tired or distracted. Wear a helmet and other protective equipment during sports activities. If you have firearms in your house, make sure you follow all gun safety procedures. Seek help if you have been physically or sexually abused. What's next? Go to your health care provider once a year for an  annual wellness visit. Ask your health care provider how often you should have your eyes and teeth checked. Stay up to date on all vaccines. This information is not intended to replace advice given to you by your health care provider. Make sure you discuss any questions you have with your health care provider. Document Revised: 02/11/2021 Document Reviewed: 02/11/2021 Elsevier Patient Education  2022 [employer].

## 2023-04-11 ENCOUNTER — Ambulatory Visit: Payer: 59 | Admitting: Family Medicine

## 2023-04-14 ENCOUNTER — Telehealth: Payer: Self-pay | Admitting: Family Medicine

## 2023-04-14 NOTE — Telephone Encounter (Signed)
Patient's mother called to follow up on patient's first visit as a new patient; stated the NP was supposed to fax orders for labs to the [facility] lab in [place].   Order hasn't been received. Please resend.  Please advise at [phone].

## 2023-05-07 LAB — COMPLETE METABOLIC PANEL WITH GFR
AG Ratio: 1.3 (calc) (ref 1.0–2.5)
ALT: 9 U/L (ref 6–29)
CO2: 29 mmol/L (ref 20–32)
Creat: 0.87 mg/dL (ref 0.50–0.97)
Globulin: 3.3 g/dL (calc) (ref 1.9–3.7)
Glucose, Bld: 151 mg/dL — ABNORMAL HIGH (ref 65–99)

## 2023-05-07 LAB — LIPID PANEL
HDL: 45 mg/dL — ABNORMAL LOW (ref >=50)
Non-HDL Cholesterol (Calc): 127 mg/dL (calc) (ref <130)
Triglycerides: 187 mg/dL — ABNORMAL HIGH (ref <150)

## 2023-05-07 LAB — VITAMIN B12: Vitamin B-12: 743 pg/mL (ref 200–1100)

## 2023-05-23 ENCOUNTER — Ambulatory Visit: Payer: 59 | Admitting: Family Medicine

## 2023-06-01 ENCOUNTER — Institutional Professional Consult (permissible substitution) (INDEPENDENT_AMBULATORY_CARE_PROVIDER_SITE_OTHER): Payer: 59 | Admitting: Otolaryngology

## 2023-06-14 ENCOUNTER — Institutional Professional Consult (permissible substitution) (INDEPENDENT_AMBULATORY_CARE_PROVIDER_SITE_OTHER): Payer: 59

## 2023-06-15 ENCOUNTER — Ambulatory Visit (INDEPENDENT_AMBULATORY_CARE_PROVIDER_SITE_OTHER): Payer: Medicaid Other | Admitting: Family Medicine

## 2023-06-15 ENCOUNTER — Encounter: Payer: Self-pay | Admitting: Family Medicine

## 2023-06-15 VITALS — BP 140/92 | HR 98 | Temp 98.0°F | Ht 63.0 in | Wt 389.0 lb

## 2023-06-15 DIAGNOSIS — R030 Elevated blood-pressure reading, without diagnosis of hypertension: Secondary | ICD-10-CM | POA: Diagnosis not present

## 2023-06-15 DIAGNOSIS — F419 Anxiety disorder, unspecified: Secondary | ICD-10-CM | POA: Diagnosis not present

## 2023-06-15 DIAGNOSIS — Z6841 Body Mass Index (BMI) 40.0 and over, adult: Secondary | ICD-10-CM

## 2023-06-15 DIAGNOSIS — Z79899 Other long term (current) drug therapy: Secondary | ICD-10-CM

## 2023-06-15 DIAGNOSIS — E119 Type 2 diabetes mellitus without complications: Secondary | ICD-10-CM

## 2023-06-15 MED ORDER — RYBELSUS 3 MG PO TABS: 3.0000 mg | ORAL_TABLET | Freq: Every day | ORAL | 0 refills | Status: AC

## 2023-06-15 MED ORDER — AMLODIPINE BESYLATE 2.5 MG PO TABS: 2.5000 mg | ORAL_TABLET | Freq: Every day | ORAL | 1 refills | Status: DC

## 2023-06-15 MED ORDER — ALPRAZOLAM 1 MG PO TABS: 1.0000 mg | ORAL_TABLET | Freq: Every day | ORAL | 0 refills | Status: DC

## 2023-06-15 MED ORDER — SEMAGLUTIDE 7 MG PO TABS: 1.0000 | ORAL_TABLET | Freq: Every day | ORAL | 1 refills | Status: DC

## 2023-06-15 NOTE — Progress Notes (Signed)
Subjective:  HPI: Caroline Yoder is a 32 y.o. female presenting on 06/27/2023 for No chief complaint on file.   HPI Patient is in today for follow up for blood pressure. At her initial visit to  establish care with me 3 months ago her BP was elevated without known history of hypertension. She was asked to monitor at home and follow up in office, she has not been seen in office since that visit. Her last A1c was also 7.7% and she has not followed up on this until today. She has not taken Metformin for at least a month due to nausea and diarrhea.  Caroline Yoder reports her anxiety is uncontrolled on Xanax 1mg  daily and states her prior PCP was going to increase this to BID due to panic attacks. Her last panic attack was several weeks ago. She has tried Lexapro in the past and reports this was ineffective, would not like to try another SSRI. She does have Ophthalmology appointment tomorrow and PAP smear coming up in November as well as ENT appointment upcoming.  HYPERTENSION without Chronic Kidney Disease Hypertension status: stable  Satisfied with current treatment?  untreated Duration of hypertension: months BP monitoring frequency:  a few times a month BP range: unsure but >140/90 BP medication side effects:  unmedicated Medication compliance:  unmedicated Previous BP meds:none Aspirin: no Recurrent headaches: no Visual changes: no Palpitations: no Dyspnea: no Chest pain: no Lower extremity edema: no Dizzy/lightheaded: no  DIABETES Hypoglycemic episodes:no Polydipsia/polyuria: no Visual disturbance: no Chest pain: no Paresthesias: no Glucose Monitoring: yes  Accucheck frequency: Daily  Fasting glucose: unsure, 90s  Post prandial:  Evening:  Before meals: Taking Insulin?: no  Long acting insulin:  Short acting insulin: Blood Pressure Monitoring: not checking Retinal Examination:  tomorrow Foot Exam: Up to Date Diabetic Education: Completed Pneumovax:   declines Influenza:  declines Aspirin: no  ANXIETY/STRESS Duration:stable Anxious mood: yes  Excessive worrying: yes Irritability: no  Sweating: no Nausea: no Palpitations:no Hyperventilation: no Panic attacks: yes Agoraphobia: no  Obscessions/compulsions: no Depressed mood: yes    2023-06-27   10:45 AM 04/07/2023    3:37 PM 03/14/2023    9:47 AM  Depression screen PHQ 2/9  Decreased Interest 0 0 0  Down, Depressed, Hopeless 1 1 1   PHQ - 2 Score 1 1 1   Altered sleeping 1 2 3   Tired, decreased energy 0 1 1  Change in appetite 1 1 1   Feeling bad or failure about yourself  0 1 1  Trouble concentrating 0 2 2  Moving slowly or fidgety/restless  0 0  Suicidal thoughts 0 0 0  PHQ-9 Score 3 8 9   Difficult doing work/chores Not difficult at all Not difficult at all Not difficult at all   Anhedonia: no Weight changes: no Insomnia: no   Hypersomnia: no Fatigue/loss of energy: yes Feelings of worthlessness: yes Feelings of guilt: yes Impaired concentration/indecisiveness: yes Suicidal ideations: no  Crying spells: no Recent Stressors/Life Changes: no   Relationship problems: no   Family stress: no     Financial stress: no    Job stress: no    Recent death/loss: no    Jun 27, 2023   10:46 AM 03/14/2023    9:48 AM  GAD 7 : Generalized Anxiety Score  Nervous, Anxious, on Edge 1 1  Control/stop worrying 1 0  Worry too much - different things 1 2  Trouble relaxing 0 2  Restless 0 1  Easily annoyed or irritable 0 1  Afraid -  awful might happen 0 0  Total GAD 7 Score 3 7  Anxiety Difficulty Not difficult at all Not difficult at all       Review of Systems  All other systems reviewed and are negative.   Relevant past medical history reviewed and updated as indicated.   Past Medical History:  Diagnosis Date   Abnormal uterine bleeding (AUB) 05/23/2015   Contraceptive management 05/23/2015   Depression    Diabetes mellitus without complication (HCC)    Hx of  chlamydia infection    Menorrhagia 02/11/2014   Obesity    Trichimoniasis 05/23/2015   Vaginal discharge 05/23/2015     No past surgical history on file.  Allergies and medications reviewed and updated.   Current Outpatient Medications:    amLODipine (NORVASC) 2.5 MG tablet, Take 1 tablet (2.5 mg total) by mouth daily., Disp: 90 tablet, Rfl: 1   furosemide (LASIX) 40 MG tablet, Take 20-40 mg by mouth daily as needed., Disp: , Rfl:    potassium chloride SA (KLOR-CON M) 20 MEQ tablet, Take 20 mEq by mouth daily., Disp: , Rfl:    Semaglutide (RYBELSUS) 3 MG TABS, Take 1 tablet (3 mg total) by mouth daily., Disp: 30 tablet, Rfl: 0   [START ON 07/15/2023] Semaglutide 7 MG TABS, Take 1 tablet (7 mg total) by mouth daily., Disp: 90 tablet, Rfl: 1   traZODone (DESYREL) 150 MG tablet, Take 150 mg by mouth at bedtime., Disp: , Rfl:    acetaminophen (TYLENOL) 325 MG tablet, Take 2 tablets (650 mg total) by mouth every 6 (six) hours as needed for mild pain, fever or headache (fever >/= 101). (Patient not taking: Reported on 06/15/2023), Disp: 12 tablet, Rfl: 0   albuterol (VENTOLIN HFA) 108 (90 Base) MCG/ACT inhaler, Inhale 2 puffs into the lungs every 6 (six) hours. (Patient not taking: Reported on 06/15/2023), Disp: 18 g, Rfl: 0   ALPRAZolam (XANAX) 1 MG tablet, Take 1 tablet (1 mg total) by mouth daily., Disp: 30 tablet, Rfl: 0   fluticasone (FLONASE) 50 MCG/ACT nasal spray, Place 2 sprays into both nostrils daily. (Patient not taking: Reported on 06/15/2023), Disp: 16 g, Rfl: 6  Allergies  Allergen Reactions   Strawberry Flavor Anaphylaxis    Objective:   BP (!) 140/92   Pulse 98   Temp 98 F (36.7 C) (Oral)   Ht 5\' 3"  (1.6 m)   Wt (!) 389 lb (176.4 kg)   SpO2 94%   BMI 68.91 kg/m      06/15/2023    9:26 AM 04/07/2023   11:32 AM 03/14/2023    9:21 AM  Vitals with BMI  Height 5\' 3"  5\' 3"  5\' 3"   Weight 389 lbs 391 lbs 391 lbs  BMI 68.93 69.28 69.28  Systolic 140 -- 148  Diastolic  92 -- 100  Pulse 98  87     Physical Exam Vitals and nursing note reviewed.  Constitutional:      Appearance: Normal appearance. She is obese.  HENT:     Head: Normocephalic and atraumatic.  Cardiovascular:     Rate and Rhythm: Normal rate and regular rhythm.     Pulses: Normal pulses.     Heart sounds: Normal heart sounds.  Pulmonary:     Effort: Pulmonary effort is normal.     Breath sounds: Normal breath sounds.  Skin:    General: Skin is warm and dry.  Neurological:     General: No focal deficit present.  Mental Status: She is alert and oriented to person, place, and time. Mental status is at baseline.  Psychiatric:        Mood and Affect: Mood normal.        Behavior: Behavior normal.        Thought Content: Thought content normal.        Judgment: Judgment normal.     Assessment & Plan:  Anxiety Assessment & Plan: She reports today infrequent panic attacks on 1mg  daily Xanax. We discussed the risks of long term use of this medication. She is not willing to try another medication at this time. Would prefer referral to Psychiatry for further evaluation and management. Denies SI/HI. Controlled substance agreement signed and UDS done today.      06/15/2023   10:46 AM 03/14/2023    9:48 AM  GAD 7 : Generalized Anxiety Score  Nervous, Anxious, on Edge 1 1  Control/stop worrying 1 0  Worry too much - different things 1 2  Trouble relaxing 0 2  Restless 0 1  Easily annoyed or irritable 0 1  Afraid - awful might happen 0 0  Total GAD 7 Score 3 7  Anxiety Difficulty Not difficult at all Not difficult at all      Orders: -     Ambulatory referral to Psychiatry -     DRUG MONITOR, PANEL 1, W/CONF, URINE  Type 2 diabetes mellitus without complication, without long-term current use of insulin (HCC) Assessment & Plan: Last A1c 7.7%. Not tolerating Metformin. Will start Rybelsus 3mg  daily for 30 days then increase to 7mg  daily as tolerated. Confirmed no personal  or family history of pancreatitis, MTC, or MEN2. uACR today. Scheduled for eye exam tomorrow. Follow up 2 months A1c and CMP  Orders: -     Microalbumin / creatinine urine ratio  Elevated blood pressure reading in office without diagnosis of hypertension Assessment & Plan: BP remains uncontrolled today and when she checks outside of office. Will start Amlodipine 2.5mg  daily and work on [employer] management with Rybelsus. Recommend heart healthy diet such as Mediterranean diet with whole grains, fruits, vegetable, fish, lean meats, nuts, and olive oil. Limit salt. Encouraged moderate walking, 3-5 times/week for 30-50 minutes each session. Aim for at least 150 minutes.week. Goal should be pace of 3 miles/hours, or walking 1.5 miles in 30 minutes. Avoid tobacco products. Avoid excess alcohol. Take medications as prescribed and bring medications and blood pressure log with cuff to each office visit. Seek medical care for chest pain, palpitations, shortness of breath with exertion, dizziness/lightheadedness, vision changes, recurrent headaches, or swelling of extremities.    Morbid obesity with BMI of 60.0-69.9, adult [facility]) Assessment & Plan: Counseled on importance of weight management for overall health. Encouraged low calorie, heart healthy diet and moderate intensity exercise 150 minutes weekly. This is 3-5 times weekly for 30-50 minutes each session. Goal should be pace of 3 miles/hours, or walking 1.5 miles in 30 minutes and include strength training. Discussed risks of obesity.   Controlled substance agreement signed -     DRUG MONITOR, PANEL 1, W/CONF, URINE  Other orders -     ALPRAZolam; Take 1 tablet (1 mg total) by mouth daily.  Dispense: 30 tablet; Refill: 0 -     Rybelsus; Take 1 tablet (3 mg total) by mouth daily.  Dispense: 30 tablet; Refill: 0 -     Semaglutide; Take 1 tablet (7 mg total) by mouth daily.  Dispense: 90 tablet; Refill: 1 -  amLODIPine Besylate; Take 1 tablet (2.5 mg  total) by mouth daily.  Dispense: 90 tablet; Refill: 1     Follow up plan: Return in about 4 weeks (around 07/13/2023) for hypertension.  Park Meo, FNP

## 2023-06-15 NOTE — Assessment & Plan Note (Signed)
BP remains uncontrolled today and when she checks outside of office. Will start Amlodipine 2.5mg  daily and work on [employer] management with Rybelsus. Recommend heart healthy diet such as Mediterranean diet with whole grains, fruits, vegetable, fish, lean meats, nuts, and olive oil. Limit salt. Encouraged moderate walking, 3-5 times/week for 30-50 minutes each session. Aim for at least 150 minutes.week. Goal should be pace of 3 miles/hours, or walking 1.5 miles in 30 minutes. Avoid tobacco products. Avoid excess alcohol. Take medications as prescribed and bring medications and blood pressure log with cuff to each office visit. Seek medical care for chest pain, palpitations, shortness of breath with exertion, dizziness/lightheadedness, vision changes, recurrent headaches, or swelling of extremities.

## 2023-06-15 NOTE — Assessment & Plan Note (Addendum)
Last A1c 7.7%. Not tolerating Metformin. Will start Rybelsus 3mg  daily for 30 days then increase to 7mg  daily as tolerated. Confirmed no personal or family history of pancreatitis, MTC, or MEN2. uACR today. Scheduled for eye exam tomorrow. Follow up 2 months A1c and CMP

## 2023-06-15 NOTE — Assessment & Plan Note (Signed)
Counseled on importance of weight management for overall health. Encouraged low calorie, heart healthy diet and moderate intensity exercise 150 minutes weekly. This is 3-5 times weekly for 30-50 minutes each session. Goal should be pace of 3 miles/hours, or walking 1.5 miles in 30 minutes and include strength training. Discussed risks of obesity.

## 2023-06-15 NOTE — Assessment & Plan Note (Signed)
She reports today infrequent panic attacks on 1mg  daily Xanax. We discussed the risks of long term use of this medication. She is not willing to try another medication at this time. Would prefer referral to Psychiatry for further evaluation and management. Denies SI/HI. Controlled substance agreement signed and UDS done today.      06/15/2023   10:46 AM 03/14/2023    9:48 AM  GAD 7 : Generalized Anxiety Score  Nervous, Anxious, on Edge 1 1  Control/stop worrying 1 0  Worry too much - different things 1 2  Trouble relaxing 0 2  Restless 0 1  Easily annoyed or irritable 0 1  Afraid - awful might happen 0 0  Total GAD 7 Score 3 7  Anxiety Difficulty Not difficult at all Not difficult at all

## 2023-06-21 ENCOUNTER — Telehealth: Payer: Self-pay

## 2023-06-21 NOTE — Telephone Encounter (Signed)
PA-Ryeblsus sent to plan

## 2023-06-23 ENCOUNTER — Telehealth: Payer: Self-pay

## 2023-06-23 NOTE — Telephone Encounter (Signed)
PA-Rybelsus sent to [facility]

## 2023-07-01 NOTE — Telephone Encounter (Signed)
  PA-Rybelsus from  [facility] Tracks has been denied

## 2023-07-13 ENCOUNTER — Ambulatory Visit: Payer: Medicaid Other | Admitting: Family Medicine

## 2023-07-17 ENCOUNTER — Other Ambulatory Visit: Payer: Self-pay

## 2023-07-17 ENCOUNTER — Emergency Department (HOSPITAL_COMMUNITY)
Admission: EM | Admit: 2023-07-17 | Discharge: 2023-07-17 | Disposition: A | Discharge status: Home / Self Care | Payer: No Typology Code available for payment source | Attending: Emergency Medicine | Admitting: Emergency Medicine

## 2023-07-17 ENCOUNTER — Encounter (HOSPITAL_COMMUNITY): Payer: Self-pay

## 2023-07-17 ENCOUNTER — Emergency Department (HOSPITAL_COMMUNITY): Payer: No Typology Code available for payment source

## 2023-07-17 DIAGNOSIS — M25521 Pain in right elbow: Secondary | ICD-10-CM | POA: Insufficient documentation

## 2023-07-17 DIAGNOSIS — M25561 Pain in right knee: Secondary | ICD-10-CM | POA: Insufficient documentation

## 2023-07-17 DIAGNOSIS — Y92410 Unspecified street and highway as the place of occurrence of the external cause: Secondary | ICD-10-CM | POA: Diagnosis not present

## 2023-07-17 DIAGNOSIS — M7918 Myalgia, other site: Secondary | ICD-10-CM

## 2023-07-17 MED ORDER — IBUPROFEN 600 MG PO TABS: 600.0000 mg | ORAL_TABLET | Freq: Three times a day (TID) | ORAL | 0 refills | Status: DC
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
## 2023-07-17 NOTE — Discharge Instructions (Signed)
Your xrays are reassuring with no broken or dislocated bones.  I recommend taking ibuprofen or tylenol for pain relief along with ice packs as much as you want for the next 2-3 days.

## 2023-07-17 NOTE — ED Notes (Signed)
Pt sitting up in bed, pt states that she is ready to go home, offered ice pack, pt states that she has some at home.  Pt verbalized understanding d/c and follow up, reviewed prescriptions. Pt ambulatory from department.
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
## 2023-07-17 NOTE — ED Notes (Signed)
Pt ambulatory to er hallway two pt c/o R side pain, states that her entire R side hurts, states that her arm and knee are worse, pt also reports some lower back pain, denies numbness or tingling, denies loss of bowel or bladder function.

## 2023-07-20 ENCOUNTER — Ambulatory Visit: Payer: Medicaid Other | Admitting: Family Medicine

## 2023-07-24 ENCOUNTER — Other Ambulatory Visit: Payer: Self-pay | Admitting: Family Medicine

## 2023-07-25 ENCOUNTER — Ambulatory Visit: Payer: Medicaid Other

## 2023-07-25 ENCOUNTER — Telehealth: Payer: Self-pay | Admitting: Family Medicine

## 2023-07-25 NOTE — Telephone Encounter (Signed)
Patient canceled her diabetic eye exam for today; stated she had it done elsewhere on 06/16/23. Patient will request for a copy of the exam to be sent to this office.

## 2023-08-25 ENCOUNTER — Other Ambulatory Visit: Payer: Self-pay | Admitting: Family Medicine

## 2023-08-26 NOTE — Telephone Encounter (Signed)
Copied from CRM [id number]. Topic: Clinical - Prescription Issue >> Aug 26, 2023  9:57 AM Shelah Lewandowsky wrote: Reason for CRM: Patient still waiting for prescription ALPRAZolam Prudy Feeler), she is now out of medication.  Please call [phone]  Please advise on refill?

## 2023-09-21 ENCOUNTER — Other Ambulatory Visit: Payer: Self-pay | Admitting: Family Medicine

## 2023-10-06 ENCOUNTER — Other Ambulatory Visit (HOSPITAL_COMMUNITY)
Admission: RE | Admit: 2023-10-06 | Discharge: 2023-10-06 | Disposition: A | Discharge status: Home / Self Care | Payer: Medicaid Other | Source: Ambulatory Visit | Attending: Women's Health | Admitting: Women's Health

## 2023-10-06 ENCOUNTER — Encounter: Payer: Self-pay | Admitting: Women's Health

## 2023-10-06 ENCOUNTER — Ambulatory Visit: Payer: Medicaid Other | Admitting: Women's Health

## 2023-10-06 VITALS — BP 130/76 | HR 80 | Ht 64.0 in | Wt 361.8 lb

## 2023-10-06 DIAGNOSIS — Z124 Encounter for screening for malignant neoplasm of cervix: Secondary | ICD-10-CM | POA: Diagnosis not present

## 2023-10-06 DIAGNOSIS — N921 Excessive and frequent menstruation with irregular cycle: Secondary | ICD-10-CM

## 2023-10-06 DIAGNOSIS — E282 Polycystic ovarian syndrome: Secondary | ICD-10-CM

## 2023-10-06 DIAGNOSIS — Z01419 Encounter for gynecological examination (general) (routine) without abnormal findings: Secondary | ICD-10-CM

## 2023-10-06 LAB — POCT HEMOGLOBIN: Hemoglobin: 9.8 g/dL — AB (ref 11–14.6)

## 2023-10-06 MED ORDER — MEGESTROL ACETATE 40 MG PO TABS: ORAL_TABLET | ORAL | 1 refills | Status: AC

## 2023-10-06 NOTE — Patient Instructions (Signed)
 Diet for Polycystic Ovary Syndrome Polycystic ovary syndrome (PCOS) is a common hormonal disorder that affects a woman's reproductive system. It can cause problems with menstrual periods and make it hard to get and stay pregnant. Changing what you eat can help your hormones reach normal levels, improve your health, and help you better manage PCOS. Following a balanced diet can help you lose weight and improve the way that your body uses the hormone insulin to control blood sugar. This may include: Eating low-fat (lean) proteins, complex carbohydrates, fresh fruits and vegetables, low-fat dairy products, healthy fats, and fiber. Cutting down on calories. Exercising regularly. What are tips for following this plan? Follow a balanced diet for meals and snacks. Eat breakfast, lunch, dinner, and one or two snacks every day. Include protein in each meal and snack. Choose whole grains instead of products that are made with refined flour. Eat a variety of foods. Exercise regularly as told by your health care provider. Aim to do at least 30 minutes of exercise on most days of the week. If you are overweight or obese: Pay attention to how many calories you eat. Cutting down on calories can help you lose weight. Work with your health care provider or a dietitian to figure out how many calories you need each day. What foods should I eat?  Fruits Include a variety of colors and types. All fruits are helpful for PCOS. Vegetables Include a variety of colors and types. All vegetables are helpful for PCOS. Grains Whole grains, such as whole wheat. Whole-grain breads, crackers, cereals, and pasta. Unsweetened oatmeal. Bulgur, barley, quinoa, and brown rice. Tortillas made from corn or whole-wheat flour. Meats and other proteins Lean proteins, such as fish, chicken, beans, eggs, and tofu. Dairy Low-fat dairy products, such as skim milk, cheese sticks, and yogurt. Beverages Low-fat or fat-free drinks, such  as water, low-fat milk, sugar-free drinks, and small amounts of 100% fruit juice. Seasonings and condiments Ketchup. Mustard. Barbecue sauce. Relish. Low-fat or fat-free mayonnaise. Fats and oils Olive oil or canola oil. Walnuts and almonds. The items listed above may not be a complete list of recommended foods and beverages. Contact a dietitian for more options. What foods should I avoid? Foods that are high in calories or fat, especially saturated or trans fats. Fried foods. Sweets. Products that are made from refined white flour, including white bread, pastries, white rice, and pasta. The items listed above may not be a complete list of foods and beverages to avoid. Contact a dietitian for more information. Summary PCOS is a hormonal imbalance that affects a woman's reproductive system. It can cause problems with menstrual periods and make it hard to get and stay pregnant. You can help to manage your PCOS by exercising regularly and eating a healthy, varied diet of vegetables, fruit, whole grains, lean protein, and low-fat dairy products. Changing what you eat can improve the way that your body uses insulin, help your hormones reach normal levels, and help you lose weight. This information is not intended to replace advice given to you by your health care provider. Make sure you discuss any questions you have with your health care provider. Document Revised: 07/04/2023 Document Reviewed: 07/04/2023 Elsevier Patient Education  2024 [employer].

## 2023-10-06 NOTE — Progress Notes (Signed)
 GYN VISIT Patient name: Caroline Yoder MRN [id number]  Date of birth: 1991/07/23 Chief Complaint:   Menorrhagia  History of Present Illness:   Caroline Yoder is a 33 y.o. G0P0 African-American female being seen today for heavy long period. LMP 1/4, stopped x 1d, started right back. Changing saturated pad q1hr, large clots, no cramping/pain. No sex in . No plan for pregnancy anytime soon. Denies abnormal discharge, itching/odor/irritation.   No female pattern hair growth, acne, hair loss, dark spots neck/underarms. States period prior to this one was maybe a year ago and that's normal for her. Has been on birth control pills in past, thinks it did help regulate some.  Patient's last menstrual period was 09/03/2023. The current method of family planning is abstinence.  Last pap unsure. Results were:  unsure     10/06/2023    8:32 AM 06/15/2023   10:45 AM 04/07/2023    3:37 PM 03/14/2023    9:47 AM  Depression screen PHQ 2/9  Decreased Interest 1 0 0 0  Down, Depressed, Hopeless 0 1 1 1   PHQ - 2 Score 1 1 1 1   Altered sleeping 3 1 2 3   Tired, decreased energy 0 0 1 1  Change in appetite 1 1 1 1   Feeling bad or failure about yourself  0 0 1 1  Trouble concentrating 0 0 2 2  Moving slowly or fidgety/restless 0  0 0  Suicidal thoughts 0 0 0 0  PHQ-9 Score 5 3 8 9   Difficult doing work/chores  Not difficult at all Not difficult at all Not difficult at all        10/06/2023    8:33 AM 06/15/2023   10:46 AM 03/14/2023    9:48 AM  GAD 7 : Generalized Anxiety Score  Nervous, Anxious, on Edge 0 1 1  Control/stop worrying 0 1 0  Worry too much - different things 0 1 2  Trouble relaxing 3 0 2  Restless 3 0 1  Easily annoyed or irritable 2 0 1  Afraid - awful might happen 0 0 0  Total GAD 7 Score 8 3 7   Anxiety Difficulty  Not difficult at all Not difficult at all     Review of Systems:   Pertinent items are noted in HPI Denies fever/chills, dizziness, headaches, visual  disturbances, fatigue, shortness of breath, chest pain, abdominal pain, vomiting, abnormal vaginal discharge/itching/odor/irritation, problems with periods, bowel movements, urination, or intercourse unless otherwise stated above.  Pertinent History Reviewed:  Reviewed past medical,surgical, social, obstetrical and family history.  Reviewed problem list, medications and allergies. Physical Assessment:   Vitals:   10/06/23 0827  BP: 130/76  Pulse: 80  Weight: (!) 361 lb 12.8 oz (164.1 kg)  Height: 5' 4 (1.626 m)  Body mass index is 62.1 kg/m.       Physical Examination:   General appearance: alert, well appearing, and in no distress  Mental status: alert, oriented to person, place, and time  Skin: warm & dry   Cardiovascular: normal heart rate noted  Respiratory: normal respiratory effort, no distress  Abdomen: soft, non-tender   Pelvic: VULVA: normal appearing vulva with no masses, tenderness or lesions, VAGINA: normal appearing vagina with normal color and discharge, no lesions, normal menstrual blood CERVIX: normal appearing cervix without discharge or lesions, UTERUS: uterus is normal size, shape, consistency and nontender, ADNEXA: normal adnexa in size, nontender and no masses  Extremities: no edema   Chaperone: Winton Cherry  Results for orders placed or performed in visit on 10/06/23 (from the past 24 hours)  POCT hemoglobin   Collection Time: 10/06/23  9:29 AM  Result Value Ref Range   Hemoglobin 9.8 (A) 11 - 14.6 g/dL    Assessment & Plan:  1) PCOS w/ menorrhagia and oligomenorrhea> fingerstick hgb 9.8, will get CBC, TSH today, rx megace  to stop bleeding, pelvic u/s and f/u after asap- will discuss options then  2) BMI 62> discussed wt loss can help w/ PCOS/regulating periods, PCP referred her to dietician, has appt coming up  Meds:  Meds ordered this encounter  Medications   megestrol  (MEGACE ) 40 MG tablet    Sig: 3x5d, 2x5d, then 1 daily to help control vaginal  bleeding. Stop taking when bleeding stops.    Dispense:  45 tablet    Refill:  1    Orders Placed This Encounter  Procedures   US  PELVIS (TRANSABDOMINAL ONLY)   US  PELVIS TRANSVAGINAL NON-OB (TV ONLY)   CBC   TSH   POCT hemoglobin    Return for 1st available gyn u/s and f/u after.  Suzen JONELLE Fetters CNM, [facility] 10/06/2023 11:44 AM

## 2023-10-07 ENCOUNTER — Telehealth: Payer: Self-pay | Admitting: Family Medicine

## 2023-10-07 LAB — CERVICOVAGINAL ANCILLARY ONLY
Bacterial Vaginitis (gardnerella): POSITIVE — AB
Candida Glabrata: NEGATIVE
Candida Vaginitis: NEGATIVE
Chlamydia: NEGATIVE
Comment: NEGATIVE
Comment: NEGATIVE
Comment: NEGATIVE
Comment: NEGATIVE
Comment: NEGATIVE
Comment: NORMAL
Neisseria Gonorrhea: NEGATIVE
Trichomonas: POSITIVE — AB

## 2023-10-07 NOTE — Telephone Encounter (Signed)
 Attempted to call pt back, no answer. LVM for pt to call back re pt's msg

## 2023-10-07 NOTE — Telephone Encounter (Signed)
 Patient stated she saw her OB/GYN provider Glinda Lapping yesterday who told her she has PCOS and needs to change one of her meds. Please see patient's AVS for additional information.   Please advise patient at [phone].

## 2023-10-10 ENCOUNTER — Encounter: Payer: Self-pay | Admitting: Women's Health

## 2023-10-10 MED ORDER — METRONIDAZOLE 500 MG PO TABS: 500.0000 mg | ORAL_TABLET | Freq: Two times a day (BID) | ORAL | 0 refills | Status: AC

## 2023-10-10 NOTE — Addendum Note (Signed)
 Addended by: Ferd Householder on: 10/10/2023 08:51 AM   Modules accepted: Orders

## 2023-10-11 ENCOUNTER — Encounter: Payer: Self-pay | Admitting: Women's Health

## 2023-10-14 ENCOUNTER — Telehealth: Payer: Self-pay | Admitting: *Deleted

## 2023-10-14 LAB — CYTOLOGY - PAP
Comment: NEGATIVE
Diagnosis: NEGATIVE
High risk HPV: NEGATIVE

## 2023-10-14 NOTE — Telephone Encounter (Signed)
LMOVM for patient to check mychart message from [facility] regarding test results.

## 2023-10-17 ENCOUNTER — Other Ambulatory Visit: Payer: Medicaid Other | Admitting: Radiology

## 2023-10-17 ENCOUNTER — Ambulatory Visit: Payer: Medicaid Other | Admitting: Women's Health

## 2023-10-17 ENCOUNTER — Encounter: Payer: Self-pay | Admitting: Women's Health

## 2023-10-21 ENCOUNTER — Other Ambulatory Visit: Payer: Self-pay | Admitting: Family Medicine

## 2023-10-26 ENCOUNTER — Encounter: Payer: Self-pay | Admitting: Women's Health

## 2023-10-26 ENCOUNTER — Encounter: Payer: Medicaid Other | Admitting: Advanced Practice Midwife

## 2023-11-23 ENCOUNTER — Other Ambulatory Visit: Payer: Self-pay

## 2023-11-23 ENCOUNTER — Telehealth: Payer: Self-pay | Admitting: Family Medicine

## 2023-11-23 ENCOUNTER — Other Ambulatory Visit: Payer: Self-pay | Admitting: Family Medicine

## 2023-11-23 MED ORDER — ALPRAZOLAM 1 MG PO TABS: 1.0000 mg | ORAL_TABLET | Freq: Every day | ORAL | 0 refills | Status: DC | PRN

## 2023-11-23 NOTE — Telephone Encounter (Signed)
 Copied from CRM [id number]. Topic: Clinical - Medication Refill >> Nov 23, 2023  8:40 AM [name] H wrote: Most Recent Primary Care Visit:  Provider: Park Meo  Department: [facility]  Visit Type: OFFICE VISIT  Date: 06/15/2023 ALPRAZolam Prudy Feeler) 1 MG tablet [[id number]] Medication:   Has the patient contacted their pharmacy? No (Agent: If no, request that the patient contact the pharmacy for the refill. If patient does not wish to contact the pharmacy document the reason why and proceed with request.) (Agent: If yes, when and what did the pharmacy advise?)  Is this the correct pharmacy for this prescription? Yes If no, delete pharmacy and type the correct one.  This is the patient's preferred pharmacy:  [id number] - [place], [place] - [address] [place] [address] Phone: [phone] Fax: [phone]   Has the prescription been filled recently? No  Is the patient out of the medication? Yes  Has the patient been seen for an appointment in the last year OR does the patient have an upcoming appointment? Yes  Can we respond through MyChart? Yes  Agent: Please be advised that Rx refills may take up to 3 business days. We ask that you follow-up with your pharmacy.

## 2023-11-23 NOTE — Telephone Encounter (Signed)
 Patient called to request expedited med refill of ALPRAZolam (XANAX) 1 MG tablet [[id number]]  Her grandma just passed away and she's on the way to the airport to fly out in an hour.  Pharmacy confirmed as  [id number] - [place], [place] - [address], [place] [address] Phone: [phone]  Fax: [phone] DEA #: [id number]

## 2023-12-09 ENCOUNTER — Encounter: Payer: Self-pay | Admitting: Family Medicine

## 2023-12-09 ENCOUNTER — Other Ambulatory Visit: Payer: Self-pay | Admitting: Family Medicine

## 2023-12-09 ENCOUNTER — Ambulatory Visit: Payer: Self-pay | Admitting: Family Medicine

## 2023-12-09 VITALS — BP 128/84 | HR 97 | Wt 354.2 lb

## 2023-12-09 DIAGNOSIS — I10 Essential (primary) hypertension: Secondary | ICD-10-CM

## 2023-12-09 DIAGNOSIS — E1159 Type 2 diabetes mellitus with other circulatory complications: Secondary | ICD-10-CM | POA: Diagnosis not present

## 2023-12-09 DIAGNOSIS — E559 Vitamin D deficiency, unspecified: Secondary | ICD-10-CM

## 2023-12-09 DIAGNOSIS — E119 Type 2 diabetes mellitus without complications: Secondary | ICD-10-CM

## 2023-12-09 DIAGNOSIS — Z7984 Long term (current) use of oral hypoglycemic drugs: Secondary | ICD-10-CM

## 2023-12-09 DIAGNOSIS — E038 Other specified hypothyroidism: Secondary | ICD-10-CM | POA: Diagnosis not present

## 2023-12-09 MED ORDER — AMLODIPINE BESYLATE 2.5 MG PO TABS: 2.5000 mg | ORAL_TABLET | Freq: Every day | ORAL | 1 refills | Status: DC

## 2023-12-09 MED ORDER — FUROSEMIDE 40 MG PO TABS: 20.0000 mg | ORAL_TABLET | Freq: Every day | ORAL | 3 refills | Status: DC | PRN

## 2023-12-09 MED ORDER — METFORMIN HCL 500 MG PO TABS: 500.0000 mg | ORAL_TABLET | Freq: Two times a day (BID) | ORAL | 1 refills | Status: AC

## 2023-12-09 MED ORDER — TRAZODONE HCL 150 MG PO TABS: 150.0000 mg | ORAL_TABLET | Freq: Every day | ORAL | 2 refills | Status: DC

## 2023-12-09 NOTE — Progress Notes (Signed)
 New Patient Office Visit   Subjective   Patient ID: Caroline Yoder, female    DOB: June 23, 1991  Age: 33 y.o. MRN: [id number]  CC:  Chief Complaint  Patient presents with   Establish Care    HPI Caroline Yoder 33 year old female, presents to establish care. She  has a past medical history of Abnormal uterine bleeding (AUB) (05/23/2015), Contraceptive management (05/23/2015), Depression, Diabetes mellitus without complication (HCC), chlamydia infection, Menorrhagia (02/11/2014), Obesity, Trichimoniasis (05/23/2015), and Vaginal discharge (05/23/2015).For the details of today's visit, please refer to assessment and plan.   HPI    Outpatient Encounter Medications as of 12/09/2023  Medication Sig   metFORMIN (GLUCOPHAGE) 500 MG tablet Take 1 tablet (500 mg total) by mouth 2 (two) times daily with a meal.   acetaminophen (TYLENOL) 325 MG tablet Take 2 tablets (650 mg total) by mouth every 6 (six) hours as needed for mild pain, fever or headache (fever >/= 101).   albuterol (VENTOLIN HFA) 108 (90 Base) MCG/ACT inhaler Inhale 2 puffs into the lungs every 6 (six) hours. (Patient not taking: Reported on 10/06/2023)   ALPRAZolam (XANAX) 1 MG tablet Take 1 tablet (1 mg total) by mouth daily as needed for anxiety.   amLODipine (NORVASC) 2.5 MG tablet Take 1 tablet (2.5 mg total) by mouth daily.   fluticasone (FLONASE) 50 MCG/ACT nasal spray Place 2 sprays into both nostrils daily. (Patient not taking: Reported on 10/06/2023)   furosemide (LASIX) 40 MG tablet Take 0.5-1 tablets (20-40 mg total) by mouth daily as needed.   ibuprofen (ADVIL) 600 MG tablet Take 1 tablet (600 mg total) by mouth 3 (three) times daily. (Patient not taking: Reported on 10/06/2023)   megestrol (MEGACE) 40 MG tablet 3x5d, 2x5d, then 1 daily to help control vaginal bleeding. Stop taking when bleeding stops.   metroNIDAZOLE (FLAGYL) 500 MG tablet Take 1 tablet (500 mg total) by mouth 2 (two) times daily.   potassium chloride SA  (KLOR-CON M) 20 MEQ tablet Take 20 mEq by mouth daily.   traZODone (DESYREL) 150 MG tablet Take 1 tablet (150 mg total) by mouth at bedtime.   [DISCONTINUED] amLODipine (NORVASC) 2.5 MG tablet Take 1 tablet (2.5 mg total) by mouth daily.   [DISCONTINUED] furosemide (LASIX) 40 MG tablet Take 20-40 mg by mouth daily as needed.   [DISCONTINUED] Semaglutide 7 MG TABS Take 1 tablet (7 mg total) by mouth daily. (Patient not taking: Reported on 10/06/2023)   [DISCONTINUED] traZODone (DESYREL) 150 MG tablet Take 150 mg by mouth at bedtime. (Patient not taking: Reported on 10/06/2023)   No facility-administered encounter medications on file as of 12/09/2023.    No past surgical history on file.  Review of Systems  Constitutional:  Negative for chills and fever.  Eyes:  Negative for blurred vision.  Respiratory:  Negative for shortness of breath.   Cardiovascular:  Negative for chest pain.  Gastrointestinal:  Negative for abdominal pain.  Genitourinary:  Negative for dysuria.  Neurological:  Positive for headaches. Negative for dizziness.      Objective    BP 128/84   Pulse 97   Wt (!) 354 lb 3.2 oz (160.7 kg)   SpO2 94%   BMI 60.80 kg/m   Physical Exam Vitals reviewed.  Constitutional:      General: She is not in acute distress.    Appearance: Normal appearance. She is not ill-appearing, toxic-appearing or diaphoretic.  HENT:     Head: Normocephalic.  Eyes:  General:        Right eye: No discharge.        Left eye: No discharge.     Conjunctiva/sclera: Conjunctivae normal.  Cardiovascular:     Rate and Rhythm: Normal rate.     Pulses: Normal pulses.     Heart sounds: Normal heart sounds.  Pulmonary:     Effort: Pulmonary effort is normal. No respiratory distress.     Breath sounds: Normal breath sounds.  Abdominal:     General: Bowel sounds are normal.     Palpations: Abdomen is soft.     Tenderness: There is no abdominal tenderness. There is no right CVA tenderness, left  CVA tenderness or guarding.  Musculoskeletal:        General: Normal range of motion.     Cervical back: Normal range of motion.  Skin:    General: Skin is warm and dry.     Capillary Refill: Capillary refill takes less than 2 seconds.  Neurological:     General: No focal deficit present.     Mental Status: She is alert and oriented to person, place, and time.     Coordination: Coordination normal.     Gait: Gait normal.  Psychiatric:        Mood and Affect: Mood normal.        Behavior: Behavior normal.       Assessment & Plan:  Primary hypertension Assessment & Plan: Vitals:   12/09/23 1422  BP: 128/84   Controlled continue amlodipine 2.5 mg once daily Labs ordered. Discussed with  patient to monitor their blood pressure regularly and maintain a heart-healthy diet rich in fruits, vegetables, whole grains, and low-fat dairy, while reducing sodium intake to less than 2,300 mg per day. Regular physical activity, such as 30 minutes of moderate exercise most days of the week, will help lower blood pressure and improve overall cardiovascular health. Avoiding smoking, limiting alcohol consumption, and managing stress. Take  prescribed medication, & take it as directed and avoid skipping doses. Seek emergency care if your blood pressure is (over 180/100) or you experience chest pain, shortness of breath, or sudden vision changes.Patient verbalizes understanding regarding plan of care and all questions answered.   Orders: -     Lipid panel -     CMP14+EGFR -     CBC with Differential/Platelet  Type 2 diabetes mellitus without complication, without long-term current use of insulin (HCC) Assessment & Plan: Last Hemoglobin A1c: 7.7 Labs: Ordered today, results pending; will follow up accordingly. The patient reports adhering to prescribed medications: Metformin 500 mg twice daily  Reviewed non-pharmacological interventions, including a balanced diet rich in lean proteins, healthy fats,  whole grains, and high-fiber vegetables. Emphasized reducing refined sugars and processed carbohydrates, and incorporating more fruits, leafy greens, and legumes. Education: Patient was educated on recognizing signs and symptoms of both hypoglycemia and hyperglycemia, and advised to seek emergency care if these symptoms occur. Follow-Up: Scheduled for follow-up in 3-4 months, or sooner if needed. Patient Understanding: The patient verbalized understanding of the care plan, and all questions were answered. Additional Care: Ophthalmology referral was placed. Foot exam results were within normal limits.   Orders: -     Microalbumin / creatinine urine ratio -     Hemoglobin A1c -     Ambulatory referral to Ophthalmology  TSH (thyroid-stimulating hormone deficiency) -     TSH + free T4  Vitamin D deficiency -     VITAMIN D 25  Hydroxy (Vit-D Deficiency, Fractures)  Other orders -     metFORMIN HCl; Take 1 tablet (500 mg total) by mouth 2 (two) times daily with a meal.  Dispense: 90 tablet; Refill: 1 -     traZODone HCl; Take 1 tablet (150 mg total) by mouth at bedtime.  Dispense: 30 tablet; Refill: 2 -     amLODIPine Besylate; Take 1 tablet (2.5 mg total) by mouth daily.  Dispense: 90 tablet; Refill: 1 -     Furosemide; Take 0.5-1 tablets (20-40 mg total) by mouth daily as needed.  Dispense: 30 tablet; Refill: 3    Return in about 3 months (around 03/09/2024), or if symptoms worsen or fail to improve, for type 2 diabetes, hypertension.   Cruzita Lederer Newman Nip, FNP

## 2023-12-09 NOTE — Patient Instructions (Signed)

## 2023-12-09 NOTE — Assessment & Plan Note (Signed)
 Vitals:   12/09/23 1422  BP: 128/84   Controlled continue amlodipine 2.5 mg once daily Labs ordered. Discussed with  patient to monitor their blood pressure regularly and maintain a heart-healthy diet rich in fruits, vegetables, whole grains, and low-fat dairy, while reducing sodium intake to less than 2,300 mg per day. Regular physical activity, such as 30 minutes of moderate exercise most days of the week, will help lower blood pressure and improve overall cardiovascular health. Avoiding smoking, limiting alcohol consumption, and managing stress. Take  prescribed medication, & take it as directed and avoid skipping doses. Seek emergency care if your blood pressure is (over 180/100) or you experience chest pain, shortness of breath, or sudden vision changes.Patient verbalizes understanding regarding plan of care and all questions answered.

## 2023-12-09 NOTE — Assessment & Plan Note (Signed)
 Last Hemoglobin A1c: 7.7 Labs: Ordered today, results pending; will follow up accordingly. The patient reports adhering to prescribed medications: Metformin 500 mg twice daily  Reviewed non-pharmacological interventions, including a balanced diet rich in lean proteins, healthy fats, whole grains, and high-fiber vegetables. Emphasized reducing refined sugars and processed carbohydrates, and incorporating more fruits, leafy greens, and legumes. Education: Patient was educated on recognizing signs and symptoms of both hypoglycemia and hyperglycemia, and advised to seek emergency care if these symptoms occur. Follow-Up: Scheduled for follow-up in 3-4 months, or sooner if needed. Patient Understanding: The patient verbalized understanding of the care plan, and all questions were answered. Additional Care: Ophthalmology referral was placed. Foot exam results were within normal limits.

## 2023-12-10 LAB — CBC WITH DIFFERENTIAL/PLATELET
Basophils Absolute: 0.1 10*3/uL (ref 0.0–0.2)
Basos: 1 %
EOS (ABSOLUTE): 0.1 10*3/uL (ref 0.0–0.4)
Eos: 1 %
Hematocrit: 36.7 % (ref 34.0–46.6)
Hemoglobin: 11 g/dL — ABNORMAL LOW (ref 11.1–15.9)
Immature Grans (Abs): 0.1 10*3/uL (ref 0.0–0.1)
Immature Granulocytes: 1 %
Lymphocytes Absolute: 2.3 10*3/uL (ref 0.7–3.1)
Lymphs: 19 %
MCH: 21.6 pg — ABNORMAL LOW (ref 26.6–33.0)
MCHC: 30 g/dL — ABNORMAL LOW (ref 31.5–35.7)
MCV: 72 fL — ABNORMAL LOW (ref 79–97)
Monocytes Absolute: 0.9 10*3/uL (ref 0.1–0.9)
Monocytes: 7 %
Neutrophils Absolute: 8.5 10*3/uL — ABNORMAL HIGH (ref 1.4–7.0)
Neutrophils: 71 %
Platelets: 522 10*3/uL — ABNORMAL HIGH (ref 150–450)
RBC: 5.1 x10E6/uL (ref 3.77–5.28)
RDW: 14.3 % (ref 11.7–15.4)
WBC: 11.9 10*3/uL — ABNORMAL HIGH (ref 3.4–10.8)

## 2023-12-10 LAB — LIPID PANEL
Chol/HDL Ratio: 3.8 ratio (ref 0.0–4.4)
Cholesterol, Total: 168 mg/dL (ref 100–199)
HDL: 44 mg/dL (ref >39)
LDL Chol Calc (NIH): 104 mg/dL — ABNORMAL HIGH (ref 0–99)
Triglycerides: 111 mg/dL (ref 0–149)
VLDL Cholesterol Cal: 20 mg/dL (ref 5–40)

## 2023-12-10 LAB — CMP14+EGFR
ALT: 7 IU/L (ref 0–32)
AST: 15 IU/L (ref 0–40)
Albumin: 4.6 g/dL (ref 3.9–4.9)
Alkaline Phosphatase: 129 IU/L — ABNORMAL HIGH (ref 44–121)
BUN/Creatinine Ratio: 10 (ref 9–23)
BUN: 14 mg/dL (ref 6–20)
Bilirubin Total: 0.2 mg/dL (ref 0.0–1.2)
CO2: 20 mmol/L (ref 20–29)
Calcium: 9.9 mg/dL (ref 8.7–10.2)
Chloride: 97 mmol/L (ref 96–106)
Creatinine, Ser: 1.41 mg/dL — ABNORMAL HIGH (ref 0.57–1.00)
Globulin, Total: 3.4 g/dL (ref 1.5–4.5)
Glucose: 221 mg/dL — ABNORMAL HIGH (ref 70–99)
Potassium: 3.6 mmol/L (ref 3.5–5.2)
Sodium: 138 mmol/L (ref 134–144)
Total Protein: 8 g/dL (ref 6.0–8.5)
eGFR: 51 mL/min/{1.73_m2} — ABNORMAL LOW (ref >59)

## 2023-12-10 LAB — TSH+FREE T4
Free T4: 1.69 ng/dL (ref 0.82–1.77)
TSH: 1.19 u[IU]/mL (ref 0.450–4.500)

## 2023-12-10 LAB — HEMOGLOBIN A1C
Est. average glucose Bld gHb Est-mCnc: 157 mg/dL
Hgb A1c MFr Bld: 7.1 % — ABNORMAL HIGH (ref 4.8–5.6)

## 2023-12-10 LAB — VITAMIN D 25 HYDROXY (VIT D DEFICIENCY, FRACTURES): Vit D, 25-Hydroxy: 7.2 ng/mL — ABNORMAL LOW (ref 30.0–100.0)

## 2023-12-13 ENCOUNTER — Encounter: Payer: Self-pay | Admitting: Family Medicine

## 2023-12-13 ENCOUNTER — Other Ambulatory Visit: Payer: Self-pay | Admitting: Family Medicine

## 2023-12-13 MED ORDER — GLIPIZIDE 5 MG PO TABS: 5.0000 mg | ORAL_TABLET | Freq: Two times a day (BID) | ORAL | 3 refills | Status: AC

## 2023-12-13 MED ORDER — ROSUVASTATIN CALCIUM 5 MG PO TABS: 5.0000 mg | ORAL_TABLET | Freq: Every day | ORAL | 3 refills | Status: AC

## 2023-12-14 ENCOUNTER — Other Ambulatory Visit: Payer: Self-pay | Admitting: Family Medicine

## 2023-12-15 MED ORDER — ALPRAZOLAM 1 MG PO TABS: 1.0000 mg | ORAL_TABLET | Freq: Every day | ORAL | 0 refills | Status: DC | PRN

## 2023-12-20 ENCOUNTER — Telehealth: Payer: Self-pay | Admitting: Family Medicine

## 2023-12-20 ENCOUNTER — Other Ambulatory Visit: Payer: Self-pay | Admitting: Family Medicine

## 2023-12-20 DIAGNOSIS — E119 Type 2 diabetes mellitus without complications: Secondary | ICD-10-CM

## 2023-12-20 NOTE — Telephone Encounter (Signed)
 Prescription Request  12/20/2023  LOV: 12/09/2023  What is the name of the medication or equipment? ALPRAZolam  (XANAX ) 1 MG tablet   amLODipine  (NORVASC ) 2.5 MG tablet [[id number]]    Have you contacted your pharmacy to request a refill? Yes   Which pharmacy would you like this sent to?  [id number] - [place], [place] - [address] [place] [address] Phone: [phone] Fax: [phone]    Patient notified that their request is being sent to the clinical staff for review and that they should receive a response within 2 business days.   Please advise at  walked into the office

## 2023-12-21 ENCOUNTER — Other Ambulatory Visit: Payer: Self-pay | Admitting: Family Medicine

## 2023-12-21 MED ORDER — ALPRAZOLAM 1 MG PO TABS: 1.0000 mg | ORAL_TABLET | Freq: Every day | ORAL | 0 refills | Status: DC | PRN

## 2023-12-21 MED ORDER — AMLODIPINE BESYLATE 2.5 MG PO TABS: 2.5000 mg | ORAL_TABLET | Freq: Every day | ORAL | 1 refills | Status: DC

## 2023-12-21 NOTE — Telephone Encounter (Signed)
 sent

## 2023-12-22 LAB — MICROALBUMIN / CREATININE URINE RATIO
Creatinine, Urine: 173.3 mg/dL
Microalb/Creat Ratio: 5 mg/g{creat} (ref 0–29)
Microalbumin, Urine: 8.6 ug/mL

## 2024-02-20 ENCOUNTER — Other Ambulatory Visit: Payer: Self-pay | Admitting: Family Medicine

## 2024-02-21 ENCOUNTER — Other Ambulatory Visit: Payer: Self-pay | Admitting: Family Medicine

## 2024-02-21 ENCOUNTER — Telehealth: Payer: Self-pay

## 2024-02-21 NOTE — Telephone Encounter (Signed)
 Copied from CRM [id number]. Topic: Clinical - Medication Question >> Feb 21, 2024 11:48 AM Donee H wrote: Reason for CRM: Patient is stating she placed request for refill ALPRAZolam  (XANAX ) 1 MG tablet. She is stating need medication today due to going out of town

## 2024-02-21 NOTE — Telephone Encounter (Signed)
 Attempted to call patient, mailbox full, states patient requested a medication, called to clarify which medication , if alprazolam  iliana sent in today

## 2024-02-21 NOTE — Telephone Encounter (Signed)
 Copied from CRM [id number]. Topic: Clinical - Medication Refill >> Feb 21, 2024  8:43 AM Dawna HERO wrote: Medication:  ALPRAZolam  (XANAX ) 1 MG tablet   Has the patient contacted their pharmacy? Yes, told to contact us  (Agent: If no, request that the patient contact the pharmacy for the refill. If patient does not wish to contact the pharmacy document the reason why and proceed with request.) (Agent: If yes, when and what did the pharmacy advise?)  This is the patient's preferred pharmacy:  [id number] - [place], [place] - [address] AT [address] [place] [address] Phone: [phone] Fax: [phone]  Is this the correct pharmacy for this prescription? Yes If no, delete pharmacy and type the correct one.   Has the prescription been filled recently? No  Is the patient out of the medication? Yes  Has the patient been seen for an appointment in the last year OR does the patient have an upcoming appointment? Yes  Can we respond through MyChart? Yes  Agent: Please be advised that Rx refills may take up to 3 business days. We ask that you follow-up with your pharmacy.

## 2024-02-21 NOTE — Telephone Encounter (Signed)
 Copied from CRM [id number]. Topic: Clinical - Medication Question >> Feb 21, 2024 12:11 PM Silvana PARAS wrote: Reason for CRM: Pt calling to f/u on medication refill request submitted today. Was on hold with a previous agent and line was disconnected. Pt called back to f/u. Callback number is [phone].

## 2024-03-18 ENCOUNTER — Other Ambulatory Visit: Payer: Self-pay | Admitting: Family Medicine

## 2024-04-12 ENCOUNTER — Ambulatory Visit: Payer: 59

## 2024-04-18 ENCOUNTER — Ambulatory Visit (INDEPENDENT_AMBULATORY_CARE_PROVIDER_SITE_OTHER): Admitting: Family Medicine

## 2024-04-18 ENCOUNTER — Encounter: Payer: Self-pay | Admitting: Family Medicine

## 2024-04-18 VITALS — BP 134/82 | HR 78 | Resp 18 | Ht 64.0 in | Wt 382.1 lb

## 2024-04-18 DIAGNOSIS — E1169 Type 2 diabetes mellitus with other specified complication: Secondary | ICD-10-CM

## 2024-04-18 DIAGNOSIS — D508 Other iron deficiency anemias: Secondary | ICD-10-CM

## 2024-04-18 DIAGNOSIS — E559 Vitamin D deficiency, unspecified: Secondary | ICD-10-CM

## 2024-04-18 DIAGNOSIS — I10 Essential (primary) hypertension: Secondary | ICD-10-CM

## 2024-04-18 DIAGNOSIS — Z7984 Long term (current) use of oral hypoglycemic drugs: Secondary | ICD-10-CM

## 2024-04-18 DIAGNOSIS — E119 Type 2 diabetes mellitus without complications: Secondary | ICD-10-CM

## 2024-04-18 DIAGNOSIS — F419 Anxiety disorder, unspecified: Secondary | ICD-10-CM

## 2024-04-18 MED ORDER — TRAZODONE HCL 150 MG PO TABS: 150.0000 mg | ORAL_TABLET | Freq: Every day | ORAL | 2 refills | Status: AC

## 2024-04-18 MED ORDER — AMLODIPINE BESYLATE 2.5 MG PO TABS: 2.5000 mg | ORAL_TABLET | Freq: Every day | ORAL | 1 refills | Status: AC

## 2024-04-18 MED ORDER — PAROXETINE HCL 10 MG PO TABS
10.0000 mg | ORAL_TABLET | Freq: Every day | ORAL | 2 refills | Status: AC
Start: 2024-04-18 — End: ?

## 2024-04-18 MED ORDER — FUROSEMIDE 40 MG PO TABS: 20.0000 mg | ORAL_TABLET | Freq: Every day | ORAL | 3 refills | Status: AC | PRN

## 2024-04-18 MED ORDER — ALPRAZOLAM 1 MG PO TABS: 1.0000 mg | ORAL_TABLET | Freq: Every day | ORAL | 0 refills | Status: DC | PRN

## 2024-04-18 NOTE — Patient Instructions (Signed)

## 2024-04-18 NOTE — Progress Notes (Signed)
 Established Patient Office Visit   Subjective  Patient ID: Caroline Yoder, female    DOB: 09-11-1990  Age: 33 y.o. MRN: [id number]  Chief Complaint  Patient presents with   Hypertension    3 month follow up     She  has a past medical history of Abnormal uterine bleeding (AUB) (05/23/2015), Contraceptive management (05/23/2015), Depression, Diabetes mellitus without complication (HCC), chlamydia infection, Menorrhagia (02/11/2014), Obesity, Trichimoniasis (05/23/2015), and Vaginal discharge (05/23/2015).  HPI Patient presents to the clinic for chronic follow up. For the details of today's visit, please refer to assessment and plan.   Review of Systems  Constitutional:  Negative for chills and fever.  Cardiovascular:  Negative for chest pain.  Genitourinary:  Negative for dysuria.  Neurological:  Positive for headaches.      Objective:     BP 134/82   Pulse 78   Resp 18   Ht 5' 4 (1.626 m)   Wt (!) 382 lb 1.9 oz (173.3 kg)   SpO2 98%   BMI 65.59 kg/m  BP Readings from Last 3 Encounters:  04/18/24 134/82  12/09/23 128/84  10/06/23 130/76      Physical Exam Vitals reviewed.  Constitutional:      General: She is not in acute distress.    Appearance: Normal appearance. She is not ill-appearing, toxic-appearing or diaphoretic.  HENT:     Head: Normocephalic.  Eyes:     General:        Right eye: No discharge.        Left eye: No discharge.     Conjunctiva/sclera: Conjunctivae normal.  Cardiovascular:     Rate and Rhythm: Normal rate.     Pulses: Normal pulses.     Heart sounds: Normal heart sounds.  Pulmonary:     Effort: Pulmonary effort is normal. No respiratory distress.     Breath sounds: Normal breath sounds.  Musculoskeletal:     Cervical back: Normal range of motion.  Skin:    General: Skin is warm and dry.     Capillary Refill: Capillary refill takes less than 2 seconds.  Neurological:     Mental Status: She is alert.  Psychiatric:         Mood and Affect: Mood normal.        Behavior: Behavior normal.      No results found for any visits on 04/18/24.  The ASCVD Risk score (Arnett DK, et al., 2019) failed to calculate for the following reasons:   The 2019 ASCVD risk score is only valid for ages 29 to 54    Assessment & Plan:  Primary hypertension Assessment & Plan: Vitals:   04/18/24 1004  BP: 134/82   Continue Amlodipine  2.5 mg once daily Labs ordered Continued discussion on DASH diet, low sodium diet and maintain a exercise routine for 150 minutes per week.   Orders: -     Lipid panel -     BMP8+eGFR -     CBC with Differential/Platelet  Vitamin D  deficiency -     VITAMIN D  25 Hydroxy (Vit-D Deficiency, Fractures)  Other iron deficiency anemia -     Iron, TIBC and Ferritin Panel  Type 2 diabetes mellitus with other specified complication, without long-term current use of insulin  (HCC) -     Hemoglobin A1c -     Ambulatory referral to Ophthalmology  Anxiety Assessment & Plan: Trial on Paxil  10 mg once daily We discussed several non-pharmacological approaches to managing anxiety:  Establishing a consistent daily routine: This helps create structure and stability. Practicing mindfulness and relaxation techniques: Incorporating meditation, deep breathing exercises, or yoga to manage stress and improve emotional well-being. Engaging in regular physical activity: Aim for at least 30 minutes of exercise most days to boost mood and energy levels. Spending time outdoors: Exposure to natural light and fresh air can improve mental health. Building a support network: Encouraging social connections with friends, family, or support groups to reduce feelings of isolation. Prioritizing a balanced diet: Eating nutrient-rich foods while avoiding excessive amounts of processed foods, sugar, and unhealthy fats. Follow-up is recommended in 4-8 weeks to assess progress.    Type 2 diabetes mellitus without complication,  without long-term current use of insulin  (HCC) Assessment & Plan: Last Hemoglobin A1c: 7.1 Labs: Ordered today, results pending; will follow up accordingly. The patient reports adhering to prescribed medications: Metformin  500 mg once daily, Glipizide  5 mg twice daily   Reviewed non-pharmacological interventions, including a balanced diet rich in lean proteins, healthy fats, whole grains, and high-fiber vegetables. Emphasized reducing refined sugars and processed carbohydrates, and incorporating more fruits, leafy greens, and legumes. Education: Patient was educated on recognizing signs and symptoms of both hypoglycemia and hyperglycemia, and advised to seek emergency care if these symptoms occur. Follow-Up: Scheduled for follow-up in 3-4 months, or sooner if needed. Patient Understanding: The patient verbalized understanding of the care plan, and all questions were answered. Additional Care: Ophthalmology referral was placed. Foot exam results were within normal limits.      Other orders -     Furosemide ; Take 0.5-1 tablets (20-40 mg total) by mouth daily as needed.  Dispense: 30 tablet; Refill: 3 -     traZODone  HCl; Take 1 tablet (150 mg total) by mouth at bedtime.  Dispense: 30 tablet; Refill: 2 -     ALPRAZolam ; Take 1 tablet (1 mg total) by mouth daily as needed for anxiety.  Dispense: 30 tablet; Refill: 0 -     amLODIPine  Besylate; Take 1 tablet (2.5 mg total) by mouth daily.  Dispense: 90 tablet; Refill: 1 -     PARoxetine  HCl; Take 1 tablet (10 mg total) by mouth daily.  Dispense: 30 tablet; Refill: 2    Return in about 6 weeks (around 05/30/2024), or if symptoms worsen or fail to improve, for medication managment, Anxiety and 4 month chronic follow up.   Hilario Kidd Wilhelmena Falter, FNP

## 2024-04-18 NOTE — Assessment & Plan Note (Signed)
 Last Hemoglobin A1c: 7.1 Labs: Ordered today, results pending; will follow up accordingly. The patient reports adhering to prescribed medications: Metformin  500 mg once daily, Glipizide  5 mg twice daily   Reviewed non-pharmacological interventions, including a balanced diet rich in lean proteins, healthy fats, whole grains, and high-fiber vegetables. Emphasized reducing refined sugars and processed carbohydrates, and incorporating more fruits, leafy greens, and legumes. Education: Patient was educated on recognizing signs and symptoms of both hypoglycemia and hyperglycemia, and advised to seek emergency care if these symptoms occur. Follow-Up: Scheduled for follow-up in 3-4 months, or sooner if needed. Patient Understanding: The patient verbalized understanding of the care plan, and all questions were answered. Additional Care: Ophthalmology referral was placed. Foot exam results were within normal limits.

## 2024-04-18 NOTE — Assessment & Plan Note (Signed)
 Vitals:   04/18/24 1004  BP: 134/82   Continue Amlodipine  2.5 mg once daily Labs ordered Continued discussion on DASH diet, low sodium diet and maintain a exercise routine for 150 minutes per week.

## 2024-04-18 NOTE — Assessment & Plan Note (Signed)
 Trial on Paxil  10 mg once daily We discussed several non-pharmacological approaches to managing anxiety:  Establishing a consistent daily routine: This helps create structure and stability. Practicing mindfulness and relaxation techniques: Incorporating meditation, deep breathing exercises, or yoga to manage stress and improve emotional well-being. Engaging in regular physical activity: Aim for at least 30 minutes of exercise most days to boost mood and energy levels. Spending time outdoors: Exposure to natural light and fresh air can improve mental health. Building a support network: Encouraging social connections with friends, family, or support groups to reduce feelings of isolation. Prioritizing a balanced diet: Eating nutrient-rich foods while avoiding excessive amounts of processed foods, sugar, and unhealthy fats. Follow-up is recommended in 4-8 weeks to assess progress.

## 2024-05-19 ENCOUNTER — Other Ambulatory Visit: Payer: Self-pay | Admitting: Family Medicine

## 2024-05-21 ENCOUNTER — Other Ambulatory Visit: Payer: Self-pay | Admitting: Family Medicine

## 2024-05-21 ENCOUNTER — Ambulatory Visit: Payer: Self-pay

## 2024-05-21 NOTE — Telephone Encounter (Signed)
 Copied from CRM [id number]. Topic: Clinical - Medication Refill >> May 21, 2024 10:35 AM Harlene ORN wrote: Medication: ALPRAZolam  (XANAX ) 1 MG tablet  Has the patient contacted their pharmacy? Yes (Agent: If no, request that the patient contact the pharmacy for the refill. If patient does not wish to contact the pharmacy document the reason why and proceed with request.) (Agent: If yes, when and what did the pharmacy advise?)  This is the patient's preferred pharmacy:  [id number] - [place], [place] - [address] AT SEC [address]  [place] [address] Phone: [phone] Fax: [phone]  Is this the correct pharmacy for this prescription? Yes If no, delete pharmacy and type the correct one.   Has the prescription been filled recently? No  Is the patient out of the medication? Yes  Has the patient been seen for an appointment in the last year OR does the patient have an upcoming appointment? Yes  Can we respond through MyChart? Yes  Agent: Please be advised that Rx refills may take up to 3 business days. We ask that you follow-up with your pharmacy.

## 2024-05-21 NOTE — Telephone Encounter (Signed)
 Refill request sent to provider.

## 2024-05-21 NOTE — Telephone Encounter (Signed)
 Patient was calling to request refill of alprazolam , pended.  [id number] - [place], [place] - [address] AT [address] [[id number]]   FYI Only or Action Required?: Action required by provider: medication refill request.  Patient was last seen in primary care on 04/18/2024 by Del Wilhelmena Lloyd Sola, FNP.  Called Nurse Triage reporting Anxiety.  Symptoms began yesterday.  Interventions attempted: Rest, hydration, or home remedies.  Symptoms are: unchanged.  Triage Disposition: Discuss With PCP and Callback by Nurse Today (overriding Home Care)  Patient/caregiver understands and will follow disposition?: Yes Reason for Disposition  MILD anxiety symptoms (e.g., anxiety symptoms are mild and intermittent; symptoms do not interfere with daily activities)  Answer Assessment - Initial Assessment Questions Patient reports panic attack last night and this morning because she is out of her alprazolam . Has been out of it x 1 day.   1. CONCERN: Did anything happen that prompted you to call today?      She is out of her medication  2. ANXIETY SYMPTOMS: Can you describe how you (your loved one; patient) have been feeling? (e.g., tense, restless, panicky, anxious, keyed up, overwhelmed, sense of impending doom).      Anxious  3. ONSET: How long have you been feeling this way? (e.g., hours, days, weeks)     2 days  4. LENGTH:     Panic attacks last up to 5 minutes  5. HISTORY: Have you felt this way before? Have you ever been diagnosed with an anxiety problem in the past? (e.g., generalized anxiety disorder, panic attacks, PTSD). If Yes, ask: How was this problem treated? (e.g., medicines, counseling, etc.)     Hx of anxiety  6. RISK OF HARM - SUICIDAL IDEATION: Do you ever have thoughts of hurting or killing yourself? If Yes, ask:  Do you have these feelings now? Do you have a plan on how you would do this?     Denies  7.  TREATMENT:  What has been done so far to treat this anxiety? (e.g., medicines, relaxation strategies). What has helped?     Alprazolam   9. THERAPIST: Do you have a counselor or therapist? If Yes, ask: What is their name?     *No Answer* 10. POTENTIAL TRIGGERS: Do you drink caffeinated beverages (e.g., coffee, colas, teas), and how much daily? Do you drink alcohol or use any drugs? Have you started any new medicines recently?       *No Answer* 11. PATIENT SUPPORT: Who is with you now? Who do you live with? Do you have family or friends who you can talk to?        *No Answer* 12. OTHER SYMPTOMS: Do you have any other symptoms? (e.g., feeling depressed, trouble concentrating, trouble sleeping, trouble breathing, palpitations or fast heartbeat, chest pain, sweating, nausea, or diarrhea)       *No Answer*  Protocols used: Anxiety and Panic Attack-A-AH Copied from CRM #[id number]. Topic: Clinical - Red Word Triage >> May 21, 2024 10:40 AM Larissa RAMAN wrote: [facility] that prompted transfer to Nurse Triage: anxiety attacks on 09/21 and 9/22

## 2024-05-22 ENCOUNTER — Other Ambulatory Visit: Payer: Self-pay | Admitting: Family Medicine

## 2024-05-22 NOTE — Telephone Encounter (Signed)
 Pt needs med refills. Enough until appt on Monday 9/29 ALPRAZolam  (XANAX ) 1 MG tablet [[id number]]

## 2024-05-22 NOTE — Telephone Encounter (Unsigned)
 Copied from CRM #[id number]. Topic: Clinical - Prescription Issue >> May 22, 2024  4:23 PM Caroline Yoder wrote: Reason for CRM: Patient is calling in for the  ALPRAZolam  (XANAX ) 1 MG tablet [[id number]], patient states she has called multiple times and multiple refill request has been submitted with no response. Patient is very frustrated by this and would like her medication asap as she is completely out of the medication.

## 2024-05-23 NOTE — Telephone Encounter (Signed)
 Sent to pharmacy

## 2024-05-28 ENCOUNTER — Ambulatory Visit: Payer: Self-pay | Admitting: Family Medicine

## 2024-05-30 ENCOUNTER — Ambulatory Visit: Admitting: Nurse Practitioner
